# Patient Record
Sex: Male | Born: 1995 | Hispanic: No | Marital: Single | Smoking: Former smoker
Health system: Southern US, Community
[De-identification: ages and names within clinical notes are randomized; demographics above are authoritative.]

## PROBLEM LIST (undated history)

## (undated) DIAGNOSIS — N289 Disorder of kidney and ureter, unspecified: Secondary | ICD-10-CM

---

## 2016-06-18 ENCOUNTER — Encounter (HOSPITAL_COMMUNITY): Payer: Self-pay | Admitting: Neurology

## 2016-06-18 ENCOUNTER — Emergency Department (HOSPITAL_COMMUNITY): Payer: Self-pay

## 2016-06-18 ENCOUNTER — Emergency Department (HOSPITAL_COMMUNITY)
Admission: EM | Admit: 2016-06-18 | Discharge: 2016-06-18 | Disposition: A | Discharge status: Home / Self Care | Payer: Self-pay | Attending: Emergency Medicine | Admitting: Emergency Medicine

## 2016-06-18 DIAGNOSIS — R1031 Right lower quadrant pain: Secondary | ICD-10-CM | POA: Insufficient documentation

## 2016-06-18 DIAGNOSIS — R109 Unspecified abdominal pain: Secondary | ICD-10-CM

## 2016-06-18 DIAGNOSIS — F17200 Nicotine dependence, unspecified, uncomplicated: Secondary | ICD-10-CM | POA: Insufficient documentation

## 2016-06-18 LAB — COMPREHENSIVE METABOLIC PANEL
ALBUMIN: 4.5 g/dL (ref 3.5–5.0)
ALK PHOS: 58 U/L (ref 38–126)
ALT: 16 U/L — AB (ref 17–63)
AST: 25 U/L (ref 15–41)
Anion gap: 8 (ref 5–15)
BILIRUBIN TOTAL: 0.6 mg/dL (ref 0.3–1.2)
BUN: 15 mg/dL (ref 6–20)
CALCIUM: 9.8 mg/dL (ref 8.9–10.3)
CO2: 24 mmol/L (ref 22–32)
CREATININE: 1 mg/dL (ref 0.61–1.24)
Chloride: 108 mmol/L (ref 101–111)
GFR calc Af Amer: >60 mL/min (ref >60)
GFR calc non Af Amer: >60 mL/min (ref >60)
GLUCOSE: 124 mg/dL — AB (ref 65–99)
POTASSIUM: 3.8 mmol/L (ref 3.5–5.1)
Sodium: 140 mmol/L (ref 135–145)
TOTAL PROTEIN: 6.8 g/dL (ref 6.5–8.1)

## 2016-06-18 LAB — URINALYSIS, ROUTINE W REFLEX MICROSCOPIC
Bilirubin Urine: NEGATIVE
GLUCOSE, UA: NEGATIVE mg/dL
HGB URINE DIPSTICK: NEGATIVE
Ketones, ur: NEGATIVE mg/dL
Leukocytes, UA: NEGATIVE
NITRITE: NEGATIVE
PH: 7 (ref 5.0–8.0)
Protein, ur: NEGATIVE mg/dL
Specific Gravity, Urine: 1.02 (ref 1.005–1.030)

## 2016-06-18 LAB — CBC
HEMATOCRIT: 41.4 % (ref 39.0–52.0)
Hemoglobin: 13.8 g/dL (ref 13.0–17.0)
MCH: 30.5 pg (ref 26.0–34.0)
MCHC: 33.3 g/dL (ref 30.0–36.0)
MCV: 91.4 fL (ref 78.0–100.0)
PLATELETS: 231 10*3/uL (ref 150–400)
RBC: 4.53 MIL/uL (ref 4.22–5.81)
RDW: 12.4 % (ref 11.5–15.5)
WBC: 5.9 10*3/uL (ref 4.0–10.5)

## 2016-06-18 LAB — LIPASE, BLOOD: Lipase: 25 U/L (ref 11–51)

## 2016-06-18 MED ORDER — SODIUM CHLORIDE 0.9 % IV BOLUS (SEPSIS)
1000.0000 mL | Freq: Once | INTRAVENOUS | Status: AC
  Administered 2016-06-18: 1000 mL via INTRAVENOUS

## 2016-06-18 NOTE — ED Provider Notes (Signed)
[facility]-EMERGENCY DEPT Provider Note   CSN: [id number] Arrival date & time: 06/18/16  1132     History   Chief Complaint Chief Complaint  Patient presents with  . Abdominal Pain    HPI Jeremaih Klima is a 20 y.o. male.  The history is provided by the patient and medical records. No language interpreter was used.   Sirus Labrie is an otherwise healthy 20 y.o. male  who presents to the Emergency Department complaining of sharp RLQ abdominal pain that radiates to his groin that began this morning associated with hematuria. Also endorses "stinging" pain with urination and intermittent nausea. Hx of kidney stones and pain today feels similar. Patient states that he had an erection yesterday which lasted approximately four hours and is unsure if this is related. He states scrotum was swollen yesterday but this has since resolved. No discharge, fevers, vomiting, constipation, diarrhea.    History reviewed. No pertinent past medical history.  There are no active problems to display for this patient.   History reviewed. No pertinent surgical history.     Home Medications    Prior to Admission medications   Not on File    Family History No family history on file.  Social History Social History  Substance Use Topics  . Smoking status: Current Every Day Smoker  . Smokeless tobacco: Never Used  . Alcohol use Yes     Allergies   Review of patient's allergies indicates no known allergies.   Review of Systems Review of Systems  Constitutional: Negative for chills and fever.  HENT: Negative for congestion.   Eyes: Negative for visual disturbance.  Respiratory: Negative for cough and shortness of breath.   Cardiovascular: Negative.   Gastrointestinal: Positive for abdominal pain. Negative for nausea and vomiting.  Genitourinary: Positive for dysuria ("Stinging") and scrotal swelling (Resolved). Negative for discharge and penile swelling.  Musculoskeletal: Negative for back  pain and neck pain.  Skin: Negative for rash.  Neurological: Negative for headaches.     Physical Exam Updated Vital Signs BP 105/90 (BP Location: Left Arm)   Pulse 66   Temp 98.2 F (36.8 C) (Oral)   Resp 14   Ht 6\' 3"  (1.905 m)   Wt 86.2 kg   SpO2 99%   BMI 23.75 kg/m   Physical Exam  Constitutional: He is oriented to person, place, and time. He appears well-developed and well-nourished. No distress.  HENT:  Head: Normocephalic and atraumatic.  Cardiovascular: Normal rate, regular rhythm and normal heart sounds.   Pulmonary/Chest: Effort normal and breath sounds normal. No respiratory distress. He has no wheezes. He has no rales. He exhibits no tenderness.  Abdominal: Soft. Bowel sounds are normal. He exhibits no distension. There is tenderness.    No tenderness at McBurney's point. No CVA tenderness.   Genitourinary:  Genitourinary Comments: Chaperone present for exam. Mild tenderness to palpation of bilateral testicles with no swelling or masses appreciated. No signs of lesion or erythema on the penis or testicles. The penis is nontender. No signs of any inguinal hernias. No signs of any discharge from the penis. Cremaster reflex present bilaterally.   Musculoskeletal: He exhibits no edema.  Neurological: He is alert and oriented to person, place, and time.  Skin: Skin is warm and dry.  Nursing note and vitals reviewed.    ED Treatments / Results  Labs (all labs ordered are listed, but only abnormal results are displayed) Labs Reviewed  COMPREHENSIVE METABOLIC PANEL - Abnormal; Notable for the following:  Result Value   Glucose, Bld 124 (*)    ALT 16 (*)    All other components within normal limits  LIPASE, BLOOD  CBC  URINALYSIS, ROUTINE W REFLEX MICROSCOPIC ([facility])    EKG  EKG Interpretation None       Radiology [place] Scrotum  Result Date: 06/18/2016 CLINICAL DATA:  20 year old male with right groin pain for 1 day with no known injury.  Initial encounter. EXAM: SCROTAL ULTRASOUND DOPPLER ULTRASOUND OF THE TESTICLES TECHNIQUE: Complete ultrasound examination of the testicles, epididymis, and other scrotal structures was performed. Color and spectral Doppler ultrasound were also utilized to evaluate blood flow to the testicles. COMPARISON:  Noncontrast CT Abdomen and Pelvis 1355 hours today. FINDINGS: Right testicle Measurements: 5.4 x 2.6 x 3.6 cm. No mass or microlithiasis visualized. Left testicle Measurements: 5.3 x 2.9 x 3.2 cm. No mass or microlithiasis visualized. Right epididymis:  Normal in size and appearance. Left epididymis: Normal in size and appearance. Incidental 8 mm epididymal head cyst (normal variant). Hydrocele:  None visualized. Varicocele:  None visualized. Pulsed Doppler interrogation of both testes demonstrates normal low resistance arterial and venous waveforms bilaterally. IMPRESSION: Normal scrotal ultrasound with Doppler. No evidence of testicular torsion. Electronically Signed   By: Odessa FlemingH  Hall M.D.   On: 06/18/2016 15:23   [place] Art/ven Flow Abd Pelv Doppler  Result Date: 06/18/2016 CLINICAL DATA:  20 year old male with right groin pain for 1 day with no known injury. Initial encounter. EXAM: SCROTAL ULTRASOUND DOPPLER ULTRASOUND OF THE TESTICLES TECHNIQUE: Complete ultrasound examination of the testicles, epididymis, and other scrotal structures was performed. Color and spectral Doppler ultrasound were also utilized to evaluate blood flow to the testicles. COMPARISON:  Noncontrast CT Abdomen and Pelvis 1355 hours today. FINDINGS: Right testicle Measurements: 5.4 x 2.6 x 3.6 cm. No mass or microlithiasis visualized. Left testicle Measurements: 5.3 x 2.9 x 3.2 cm. No mass or microlithiasis visualized. Right epididymis:  Normal in size and appearance. Left epididymis: Normal in size and appearance. Incidental 8 mm epididymal head cyst (normal variant). Hydrocele:  None visualized. Varicocele:  None visualized. Pulsed Doppler  interrogation of both testes demonstrates normal low resistance arterial and venous waveforms bilaterally. IMPRESSION: Normal scrotal ultrasound with Doppler. No evidence of testicular torsion. Electronically Signed   By: Odessa FlemingH  Hall M.D.   On: 06/18/2016 15:23   Ct Renal Stone Study  Result Date: 06/18/2016 CLINICAL DATA:  Right flank pain and hematuria. EXAM: CT ABDOMEN AND PELVIS WITHOUT CONTRAST TECHNIQUE: Multidetector CT imaging of the abdomen and pelvis was performed following the standard protocol without IV contrast. COMPARISON:  None. FINDINGS: Lower chest: Right lower lobe 3 mm pulmonary nodule (series 6/image 1). Hepatobiliary: Normal liver with no liver mass. Normal gallbladder with no radiopaque cholelithiasis. No biliary ductal dilatation. Pancreas: Normal, with no mass or duct dilation. Spleen: Normal size. No mass. Adrenals/Urinary Tract: Normal adrenals. No renal stones. No hydronephrosis. No contour deforming renal mass. Normal caliber ureters, with no ureteral stones. No bladder stones. No bladder wall thickening. Stomach/Bowel: Grossly normal stomach. Normal caliber small bowel with no small bowel wall thickening. Normal appendix. Normal large bowel with no diverticulosis, large bowel wall thickening or pericolonic fat stranding. Vascular/Lymphatic: Normal caliber abdominal aorta. No pathologically enlarged lymph nodes in the abdomen or pelvis. Reproductive: Normal size prostate. Other: No pneumoperitoneum, ascites or focal fluid collection. Musculoskeletal: No aggressive appearing focal osseous lesions. IMPRESSION: 1. No acute abnormality. No urolithiasis. No evidence of urinary tract obstruction. No evidence of bowel  obstruction or acute bowel inflammation. 2. Solitary 3 mm right lower lobe pulmonary nodule, for which no further follow-up is recommended unless there are significant risk factors for lung malignancy. Electronically Signed   By: Delbert Phenix M.D.   On: 06/18/2016 14:11     Procedures Procedures (including critical care time)  Medications Ordered in ED Medications  sodium chloride 0.9 % bolus 1,000 mL (1,000 mLs Intravenous New Bag/Given 06/18/16 1319)     Initial Impression / Assessment and Plan / ED Course  I have reviewed the triage vital signs and the nursing notes.  Pertinent labs & imaging results that were available during my care of the patient were reviewed by me and considered in my medical decision making (see chart for details).  Clinical Course   Elric Tirado is a 20 y.o. male who presents to ED for right flank and groin pain since this morning. Patient is well appearing, afebrile and hemodynamically stable. GU exam with mild tenderness to bilateral testicles but otherwise benign. Labs reviewed and reassuring. CT stone study and scrotal ultrasound reassuring. Evaluation does not show pathology that would require ongoing emergent intervention or inpatient treatment. PCP follow up if symptoms persist. Discussed findings and plan with patient who agrees with treatment plan as dictated. Return precautions discussed and all questions answered.   Final Clinical Impressions(s) / ED Diagnoses   Final diagnoses:  Right flank pain  Right groin pain  Right groin pain    New Prescriptions New Prescriptions   No medications on file     [facility], PA-C 06/18/16 1604    Marily Memos, MD 06/19/16 [phone]

## 2016-06-18 NOTE — ED Triage Notes (Addendum)
Pt reports today was at work, felt sharp pain RLQ radiating into right testicle and rectum. Went to bathroom and had blood in urine, x 2. Pain is intermittent. Reports nausea, denies v/d. No swelling in testicle. Reports long lasting erection for 4 hours yesterday. Pt is a x 4, sitting comfortably in chair, answering questions appropriately. Also reports penile pain and feeling like it might bust.

## 2016-06-18 NOTE — ED Notes (Signed)
Pt unable le to void at this time.

## 2016-06-18 NOTE — Discharge Instructions (Signed)
Ibuprofen as needed for pain. Follow up with primary care provider if symptoms persist.  Return to ER for new or worsening symptoms, any additional concerns.

## 2016-11-06 ENCOUNTER — Emergency Department (HOSPITAL_COMMUNITY): Payer: Self-pay

## 2016-11-06 ENCOUNTER — Encounter (HOSPITAL_COMMUNITY): Payer: Self-pay | Admitting: Emergency Medicine

## 2016-11-06 ENCOUNTER — Emergency Department (HOSPITAL_COMMUNITY)
Admission: EM | Admit: 2016-11-06 | Discharge: 2016-11-06 | Disposition: A | Discharge status: Home / Self Care | Payer: Self-pay | Attending: Emergency Medicine | Admitting: Emergency Medicine

## 2016-11-06 DIAGNOSIS — F17200 Nicotine dependence, unspecified, uncomplicated: Secondary | ICD-10-CM | POA: Insufficient documentation

## 2016-11-06 DIAGNOSIS — R1013 Epigastric pain: Secondary | ICD-10-CM | POA: Insufficient documentation

## 2016-11-06 DIAGNOSIS — Z79899 Other long term (current) drug therapy: Secondary | ICD-10-CM | POA: Insufficient documentation

## 2016-11-06 LAB — COMPREHENSIVE METABOLIC PANEL
ALK PHOS: 47 U/L (ref 38–126)
ALT: 11 U/L — AB (ref 17–63)
AST: 16 U/L (ref 15–41)
Albumin: 4.7 g/dL (ref 3.5–5.0)
Anion gap: 9 (ref 5–15)
BILIRUBIN TOTAL: 0.8 mg/dL (ref 0.3–1.2)
BUN: 14 mg/dL (ref 6–20)
CALCIUM: 9.4 mg/dL (ref 8.9–10.3)
CO2: 24 mmol/L (ref 22–32)
Chloride: 106 mmol/L (ref 101–111)
Creatinine, Ser: 1 mg/dL (ref 0.61–1.24)
GFR calc Af Amer: >60 mL/min (ref >60)
GFR calc non Af Amer: >60 mL/min (ref >60)
Glucose, Bld: 90 mg/dL (ref 65–99)
Potassium: 3.8 mmol/L (ref 3.5–5.1)
SODIUM: 139 mmol/L (ref 135–145)
TOTAL PROTEIN: 7.4 g/dL (ref 6.5–8.1)

## 2016-11-06 LAB — URINALYSIS, ROUTINE W REFLEX MICROSCOPIC
BACTERIA UA: NONE SEEN
Bilirubin Urine: NEGATIVE
Glucose, UA: NEGATIVE mg/dL
Hgb urine dipstick: NEGATIVE
KETONES UR: NEGATIVE mg/dL
Leukocytes, UA: NEGATIVE
NITRITE: NEGATIVE
PH: 7 (ref 5.0–8.0)
PROTEIN: NEGATIVE mg/dL
Specific Gravity, Urine: 1.025 (ref 1.005–1.030)
Squamous Epithelial / LPF: NONE SEEN

## 2016-11-06 LAB — CBC
HCT: 38.8 % — ABNORMAL LOW (ref 39.0–52.0)
Hemoglobin: 13.1 g/dL (ref 13.0–17.0)
MCH: 30.4 pg (ref 26.0–34.0)
MCHC: 33.8 g/dL (ref 30.0–36.0)
MCV: 90 fL (ref 78.0–100.0)
Platelets: 198 10*3/uL (ref 150–400)
RBC: 4.31 MIL/uL (ref 4.22–5.81)
RDW: 12.3 % (ref 11.5–15.5)
WBC: 5 10*3/uL (ref 4.0–10.5)

## 2016-11-06 LAB — LIPASE, BLOOD: Lipase: 25 U/L (ref 11–51)

## 2016-11-06 MED ORDER — GI COCKTAIL ~~LOC~~
30.0000 mL | Freq: Once | ORAL | Status: AC
  Administered 2016-11-06: 30 mL via ORAL
  Filled 2016-11-06: qty 30

## 2016-11-06 MED ORDER — PANTOPRAZOLE SODIUM 40 MG IV SOLR
40.0000 mg | Freq: Once | INTRAVENOUS | Status: AC
  Administered 2016-11-06: 40 mg via INTRAVENOUS
  Filled 2016-11-06: qty 40

## 2016-11-06 MED ORDER — SUCRALFATE 1 G PO TABS: 1.0000 g | ORAL_TABLET | Freq: Three times a day (TID) | ORAL | 0 refills | Status: DC

## 2016-11-06 MED ORDER — METOCLOPRAMIDE HCL 10 MG PO TABS: 10.0000 mg | ORAL_TABLET | Freq: Four times a day (QID) | ORAL | 0 refills | Status: DC | PRN

## 2016-11-06 MED ORDER — METOCLOPRAMIDE HCL 5 MG/ML IJ SOLN
10.0000 mg | Freq: Once | INTRAMUSCULAR | Status: AC
  Administered 2016-11-06: 10 mg via INTRAVENOUS
  Filled 2016-11-06: qty 2

## 2016-11-06 MED ORDER — OMEPRAZOLE 20 MG PO CPDR: 20.0000 mg | DELAYED_RELEASE_CAPSULE | Freq: Every day | ORAL | 0 refills | Status: DC

## 2016-11-06 MED ORDER — SODIUM CHLORIDE 0.9 % IV BOLUS (SEPSIS)
1000.0000 mL | Freq: Once | INTRAVENOUS | Status: AC
  Administered 2016-11-06: 1000 mL via INTRAVENOUS

## 2016-11-06 NOTE — Discharge Instructions (Signed)
Read the information below.  Use the prescribed medication as directed.  Please discuss all new medications with your pharmacist.  You may return to the Emergency Department at any time for worsening condition or any new symptoms that concern you.   If you develop high fevers, worsening abdominal pain, uncontrolled vomiting, or are unable to tolerate fluids by mouth, return to the ER for a recheck.  ° °

## 2016-11-06 NOTE — ED Provider Notes (Signed)
[facility]-EMERGENCY DEPT Provider Note   CSN: [id number] Arrival date & time: 11/06/16  1559     History   Chief Complaint Chief Complaint  Patient presents with  . Abdominal Pain    LUQ    HPI Brian Frank is a 21 y.o. male.  HPI   Pt presents with 3 weeks of upper abdominal pressure, bloated, belching, vomiting, worse after eating.  The pain and symptoms occur immediately after eating, also worse with leaning forward.  Nausea somewhat controlled with marijuana.  Feels the urge to defecate but has had decreased bowel movements.  One episode of black stool, no red bloody stools.  Also occasionally passes stool when attempting to pas flatus.  Denies NSAID usage, etoh use.  No heavy lifting.  The symptoms began after the patient spent two weeks eating only [employer] pizza and only one meal/day.  No family hx inflammatory bowel disease.  Mother has hx gastric ulcer.    History reviewed. No pertinent past medical history.  There are no active problems to display for this patient.   History reviewed. No pertinent surgical history.     Home Medications    Prior to Admission medications   Medication Sig Start Date End Date Taking? Authorizing Provider  metoCLOPramide (REGLAN) 10 MG tablet Take 1 tablet (10 mg total) by mouth every 6 (six) hours as needed for nausea or vomiting. 11/06/16   Trixie Dredge, PA-C  omeprazole (PRILOSEC) 20 MG capsule Take 1 capsule (20 mg total) by mouth daily. 11/06/16   Trixie Dredge, PA-C  sucralfate (CARAFATE) 1 g tablet Take 1 tablet (1 g total) by mouth 4 (four) times daily -  with meals and at bedtime. 11/06/16   Trixie Dredge, PA-C    Family History No family history on file.  Social History Social History  Substance Use Topics  . Smoking status: Current Every Day Smoker  . Smokeless tobacco: Never Used  . Alcohol use Yes     Allergies   Patient has no known allergies.   Review of Systems Review of Systems  All other systems reviewed and are  negative.    Physical Exam Updated Vital Signs BP 108/77   Pulse 72   Temp 98 F (36.7 C)   Resp 18   SpO2 100%   Physical Exam  Constitutional: He appears well-developed and well-nourished. No distress.  HENT:  Head: Normocephalic and atraumatic.  Neck: Neck supple.  Cardiovascular: Normal rate and regular rhythm.   Pulmonary/Chest: Effort normal and breath sounds normal. No respiratory distress. He has no wheezes. He has no rales.  Abdominal: Soft. He exhibits no distension and no mass. There is tenderness (diffuse tenderness, worse in upper abdomen). There is no rebound, no guarding and negative Murphy's sign.  Neurological: He is alert. He exhibits normal muscle tone.  Skin: He is not diaphoretic.  Nursing note and vitals reviewed.    ED Treatments / Results  Labs (all labs ordered are listed, but only abnormal results are displayed) Labs Reviewed  COMPREHENSIVE METABOLIC PANEL - Abnormal; Notable for the following:       Result Value   ALT 11 (*)    All other components within normal limits  CBC - Abnormal; Notable for the following:    HCT 38.8 (*)    All other components within normal limits  URINALYSIS, ROUTINE W REFLEX MICROSCOPIC - Abnormal; Notable for the following:    APPearance CLOUDY (*)    All other components within normal limits  LIPASE,  BLOOD    EKG  EKG Interpretation None       Radiology [place] Abdomen Complete  Result Date: 11/06/2016 CLINICAL DATA:  Upper abdominal pain, bloating, and vomiting. Left upper quadrant pain after eating with nausea and vomiting for 3 weeks. EXAM: ABDOMEN ULTRASOUND COMPLETE COMPARISON:  CT abdomen and pelvis 06/18/2016 FINDINGS: Gallbladder: No gallstones or wall thickening visualized. No sonographic Murphy sign noted by sonographer. Common bile duct: Diameter: 2.6 mm, normal Liver: No focal lesion identified. Within normal limits in parenchymal echogenicity. IVC: Limited visualization due to bowel gas. Visualized  intrahepatic portion is unremarkable. Pancreas: Visualized portion unremarkable. Spleen: Size and appearance within normal limits. Right Kidney: Length: 12.5 cm. Echogenicity within normal limits. No mass or hydronephrosis visualized. Left Kidney: Length: 10.8 cm. Echogenicity within normal limits. No mass or hydronephrosis visualized. Abdominal aorta: No aneurysm visualized. Other findings: None. IMPRESSION: No acute abnormalities demonstrated. Electronically Signed   By: Burman NievesWilliam  Stevens M.D.   On: 11/06/2016 21:47    Procedures Procedures (including critical care time)  Medications Ordered in ED Medications  sodium chloride 0.9 % bolus 1,000 mL (0 mLs Intravenous Stopped 11/06/16 2200)  metoCLOPramide (REGLAN) injection 10 mg (10 mg Intravenous Given 11/06/16 2008)  pantoprazole (PROTONIX) injection 40 mg (40 mg Intravenous Given 11/06/16 2009)  gi cocktail (Maalox,Lidocaine,Donnatal) (30 mLs Oral Given 11/06/16 2008)     Initial Impression / Assessment and Plan / ED Course  I have reviewed the triage vital signs and the nursing notes.  Pertinent labs & imaging results that were available during my care of the patient were reviewed by me and considered in my medical decision making (see chart for details).    Discussed imaging with patient, he is uncomfortable with radiation (including CTs and xrays), US abdomen ordered.   Afebrile, nontoxic patient with upper abdominal pain, bloating, belching, N/V, change in bowel habits x weeks.  Labs unremarkable.  US negative.  Pt feeling much better after IVF, reglan, protonix, GI cocktail.  Suspect PUD vs less likely GERD or gastroparesis. D/C home with omeprazole, reglan, carafate, GI follow up.   Discussed result, findings, treatment, and follow up  with patient.  Pt given return precautions.  Pt verbalizes understanding and agrees with plan.       Final Clinical Impressions(s) / ED Diagnoses   Final diagnoses:  Epigastric pain    New  Prescriptions Discharge Medication List as of 11/06/2016 10:10 PM    START taking these medications   Details  metoCLOPramide (REGLAN) 10 MG tablet Take 1 tablet (10 mg total) by mouth every 6 (six) hours as needed for nausea or vomiting., Starting Wed 11/06/2016, Print    omeprazole (PRILOSEC) 20 MG capsule Take 1 capsule (20 mg total) by mouth daily., Starting Wed 11/06/2016, Print    sucralfate (CARAFATE) 1 g tablet Take 1 tablet (1 g total) by mouth 4 (four) times daily -  with meals and at bedtime., Starting Wed 11/06/2016, Print         Moroni Nester MiddlesexEmily Naim Murtha, PA-C 11/06/16 2246    Derwood KaplanAnkit Nanavati, MD 11/07/16 1422

## 2016-11-06 NOTE — ED Triage Notes (Signed)
Pt reports burning abd pain ULQ, sts pain is worse after eating or when he is hungry. denies Dx acid reflux nor gastric ulcer. Reports emesis x 1 today. intermittent diarrhea and constipation.

## 2016-11-06 NOTE — ED Notes (Signed)
Pt called for reasses vitals no response. 

## 2017-11-06 IMAGING — CT CT RENAL STONE PROTOCOL
2 of 4 series · 16 of 46 positions shown, 18 images · non-contrast
Comparison: None.

CLINICAL DATA: Right flank pain and hematuria.

EXAM:
CT ABDOMEN AND PELVIS WITHOUT CONTRAST
TECHNIQUE: Multidetector CT imaging of the abdomen and pelvis was performed
following the standard protocol without IV contrast.

[Series 2: renal stone 5mm · axial · 0.69mm/px · z∈[+945,+1390]mm · 13 of 99 slices shown, 15 images]
[im 5/99  soft-tissue]
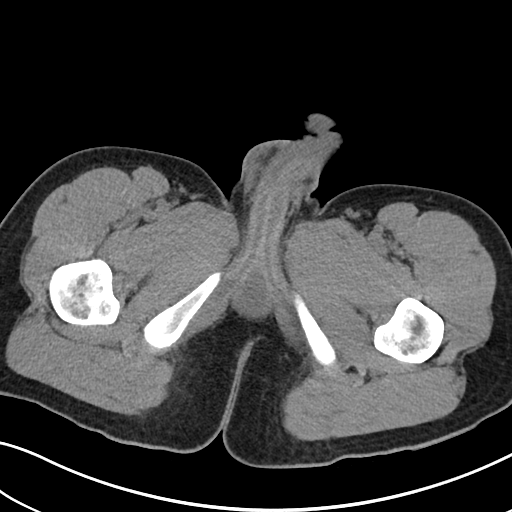
[im 5/99  bone]
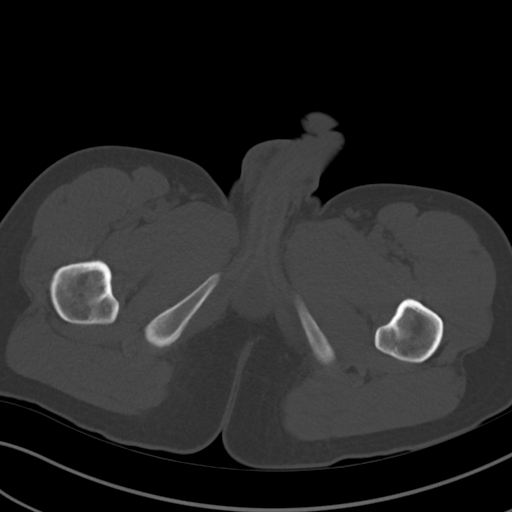
[im 13/99  soft-tissue]
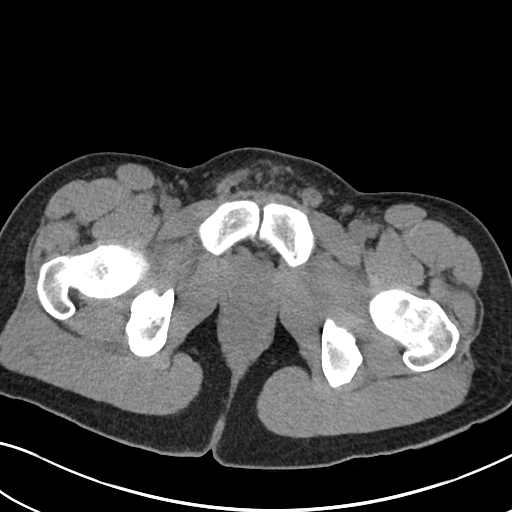
[im 21/99  soft-tissue]
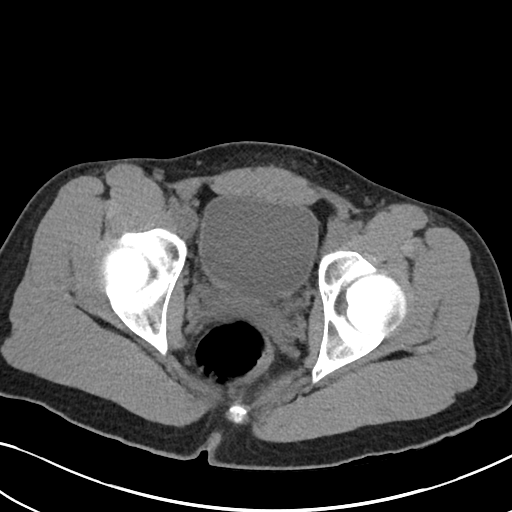
[im 29/99  soft-tissue]
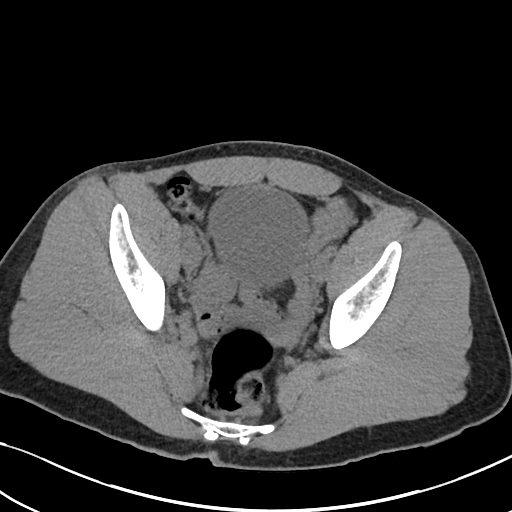
[im 33/99  soft-tissue]
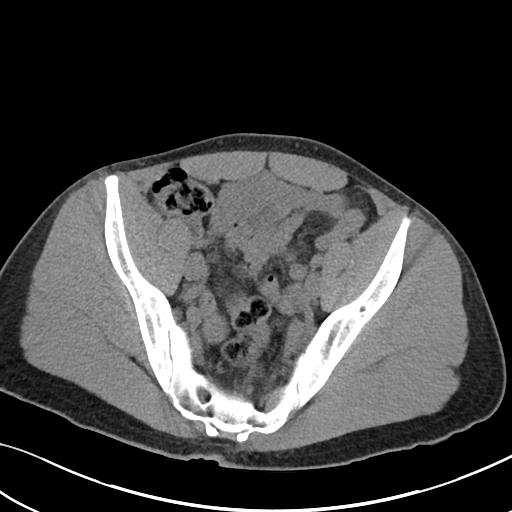
[im 41/99  soft-tissue]
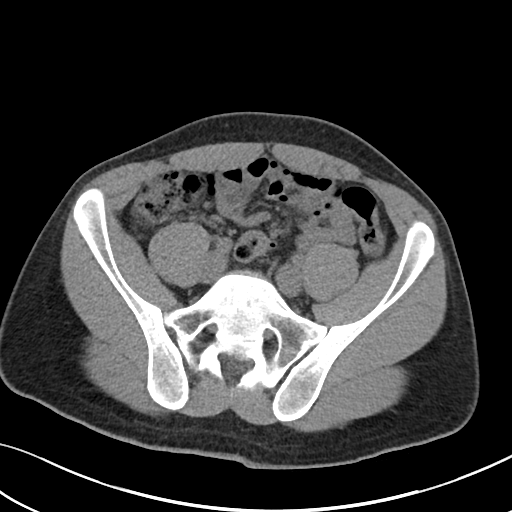
[im 50/99  soft-tissue]
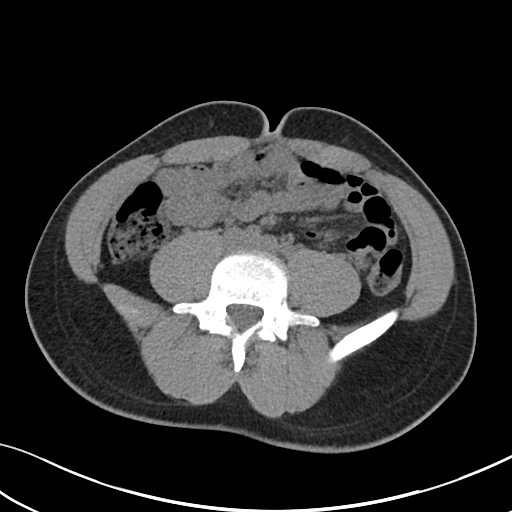
[im 58/99  soft-tissue]
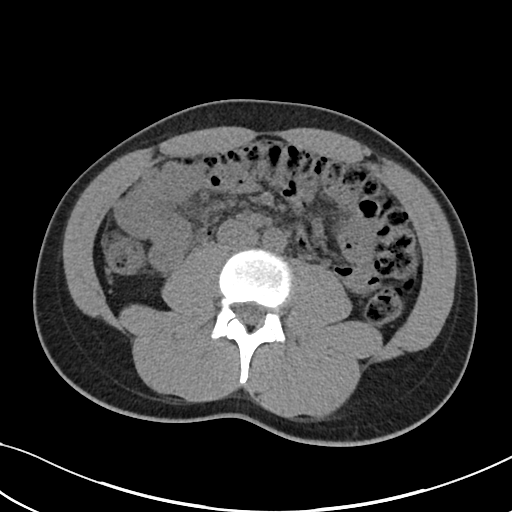
[im 66/99  soft-tissue]
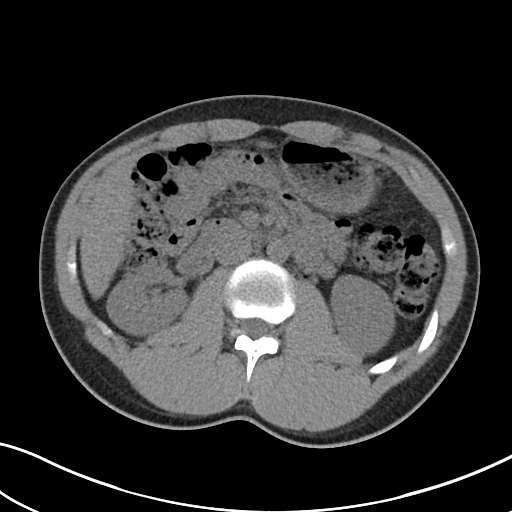
[im 66/99  bone]
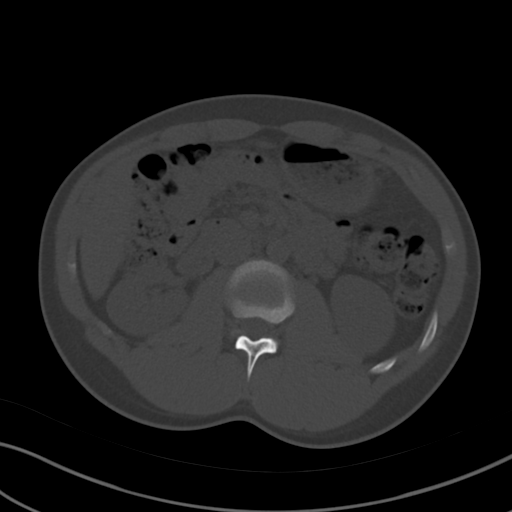
[im 70/99  soft-tissue]
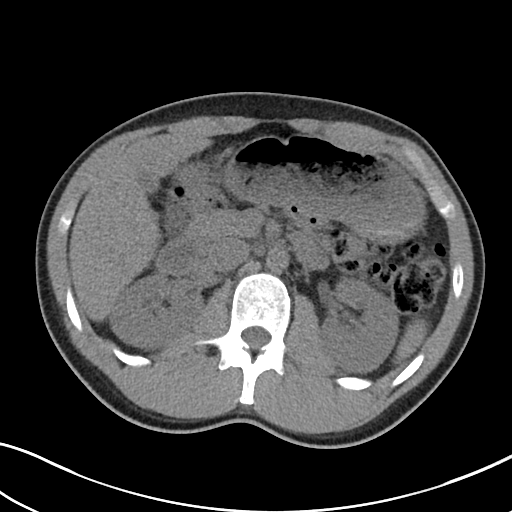
[im 78/99  soft-tissue]
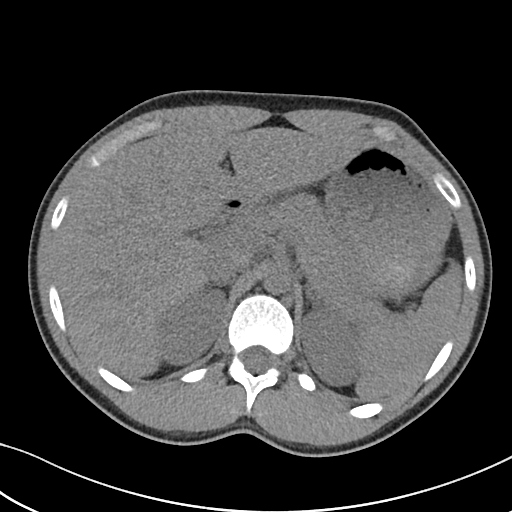
[im 86/99  soft-tissue]
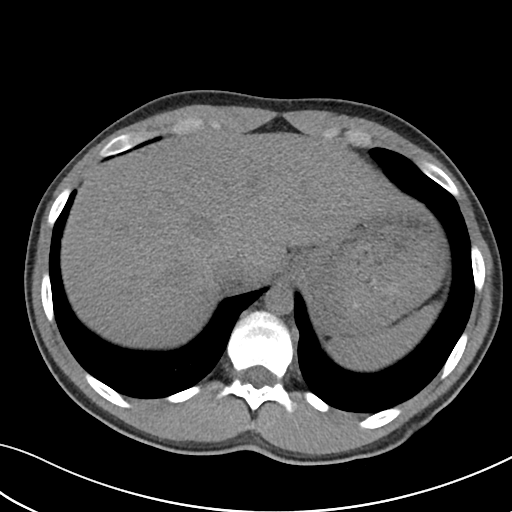
[im 94/99  soft-tissue]
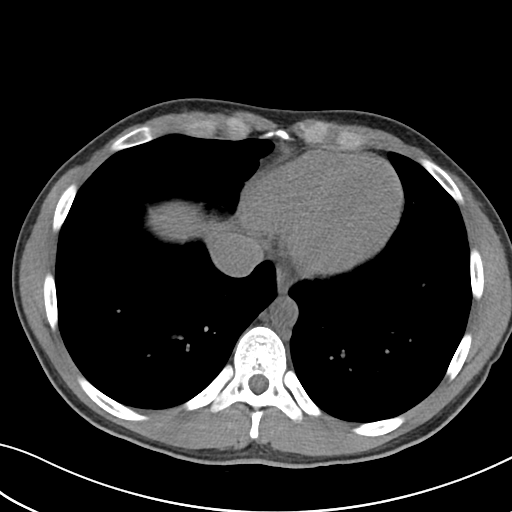

[Series 4: renal stone 3.0 cor · coronal · 0.69mm/px · 3 of 101 slices shown]
[im 34/101  soft-tissue]
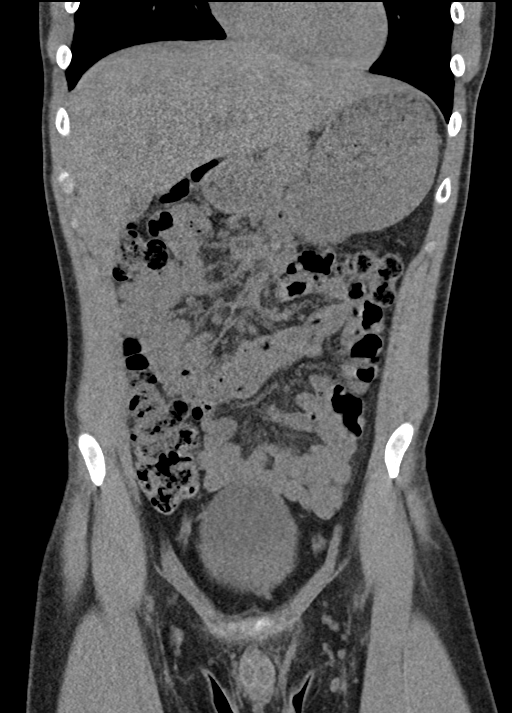
[im 45/101  soft-tissue]
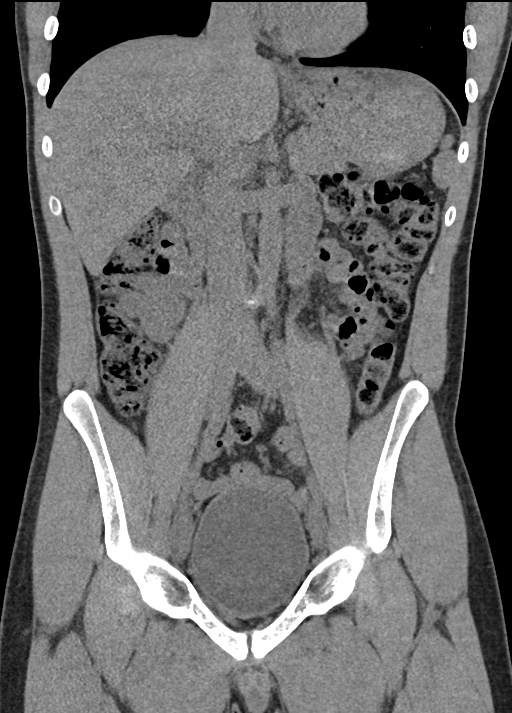
[im 56/101  soft-tissue]
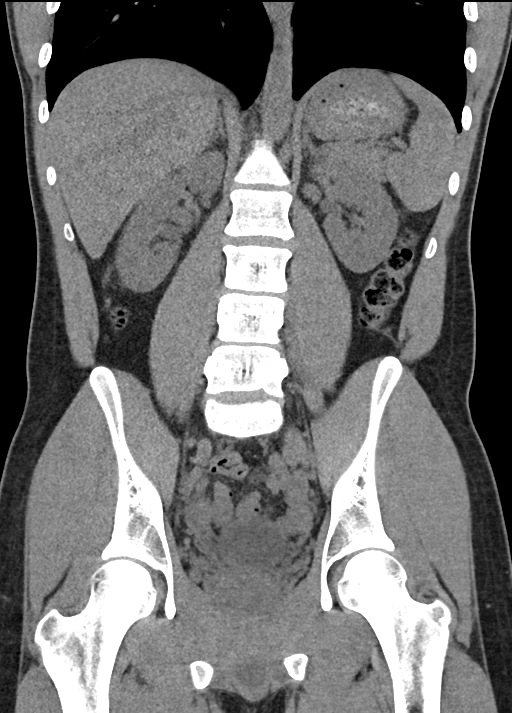

[16 of 46 positions shown; findings below may reference images not displayed]

FINDINGS: Lower chest: Right lower lobe 3 mm pulmonary nodule (series 6/image
1).

Hepatobiliary: Normal liver with no liver mass. Normal gallbladder
with no radiopaque cholelithiasis. No biliary ductal dilatation.

Pancreas: Normal, with no mass or duct dilation.

Spleen: Normal size. No mass.

Adrenals/Urinary Tract: Normal adrenals. No renal stones. No
hydronephrosis. No contour deforming renal mass. Normal caliber
ureters, with no ureteral stones. No bladder stones. No bladder wall
thickening.

Stomach/Bowel: Grossly normal stomach. Normal caliber small bowel
with no small bowel wall thickening. Normal appendix. Normal large
bowel with no diverticulosis, large bowel wall thickening or
pericolonic fat stranding.

Vascular/Lymphatic: Normal caliber abdominal aorta. No
pathologically enlarged lymph nodes in the abdomen or pelvis.

Reproductive: Normal size prostate.

Other: No pneumoperitoneum, ascites or focal fluid collection.

Musculoskeletal: No aggressive appearing focal osseous lesions.
IMPRESSION: 1. No acute abnormality. No urolithiasis. No evidence of urinary
tract obstruction. No evidence of bowel obstruction or acute bowel
inflammation.
2. Solitary 3 mm right lower lobe pulmonary nodule, for which no
further follow-up is recommended unless there are significant risk
factors for lung malignancy.

## 2018-06-21 IMAGING — US US ART/VEN ABD/PELV/SCROTUM DOPPLER LTD
1 series · 14 of 25 positions shown · non-contrast
Comparison: Noncontrast CT Abdomen and Pelvis 2700 hours today.

CLINICAL DATA: 20-year-old male with right groin pain for 1 day
with no known injury. Initial encounter.

EXAM:
SCROTAL ULTRASOUND
DOPPLER ULTRASOUND OF THE TESTICLES
TECHNIQUE: Complete ultrasound examination of the testicles, epididymis, and
other scrotal structures was performed. Color and spectral Doppler
ultrasound were also utilized to evaluate blood flow to the
testicles.

[Series 1: us art/ven abd/pelv/scrotum doppler ltd · 0.06mm/px · 14 of 53 slices shown]
[im 1/53]
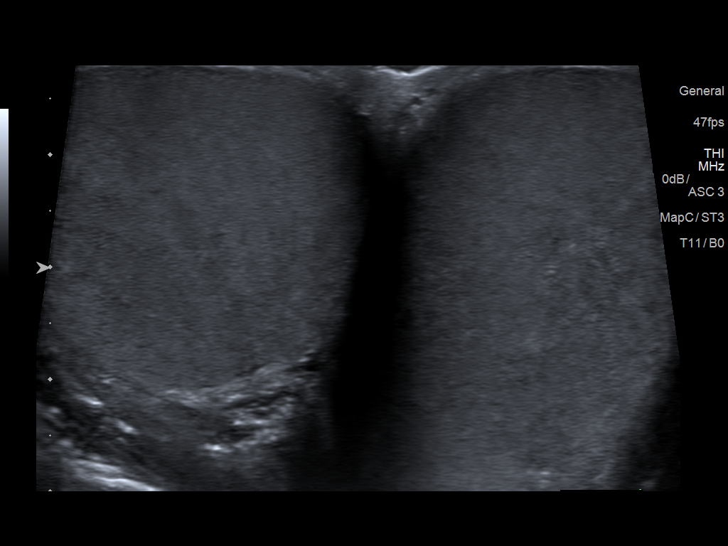
[im 5/53]
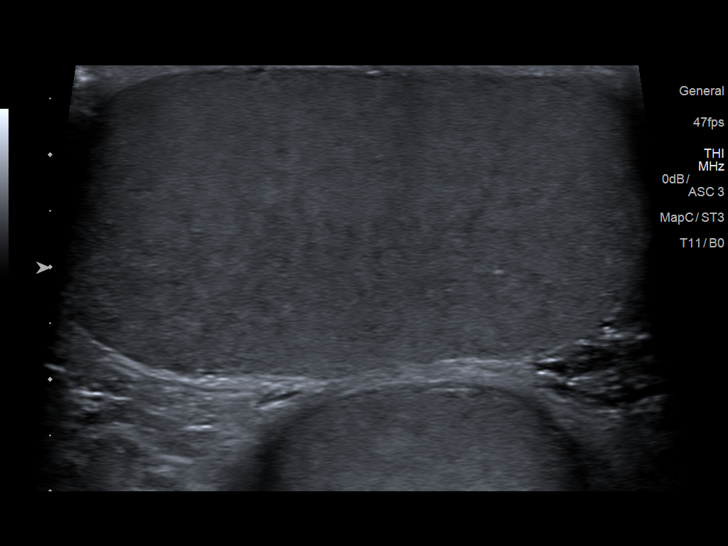
[im 9/53]
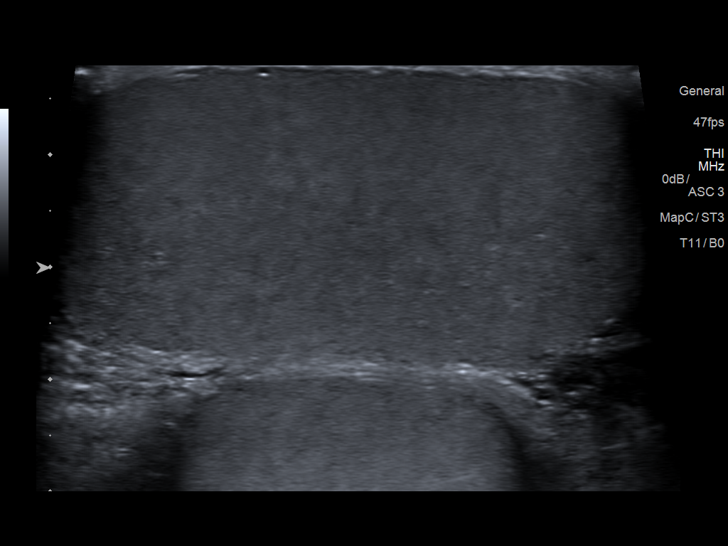
[im 14/53]
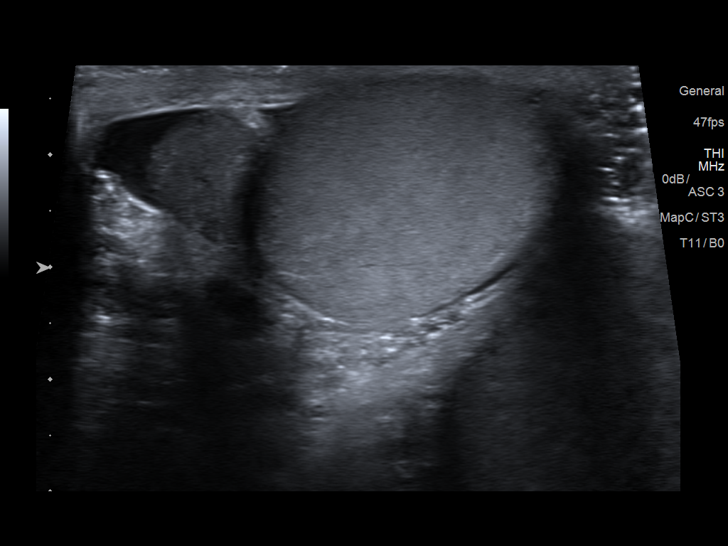
[im 18/53]
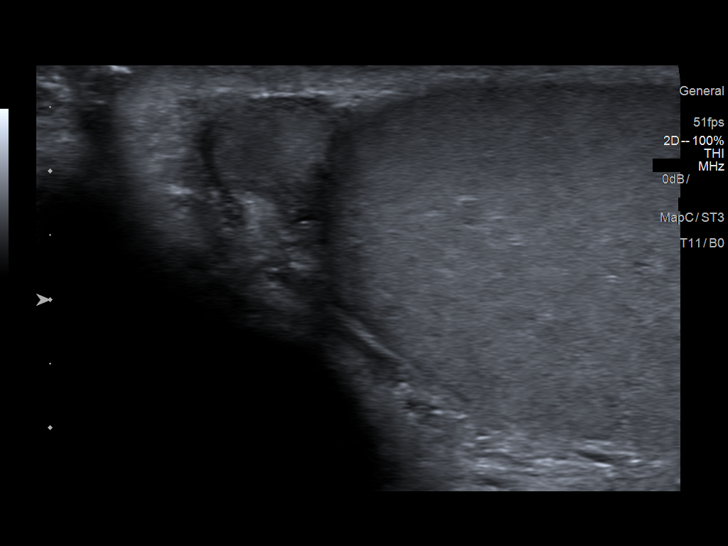
[im 20/53]
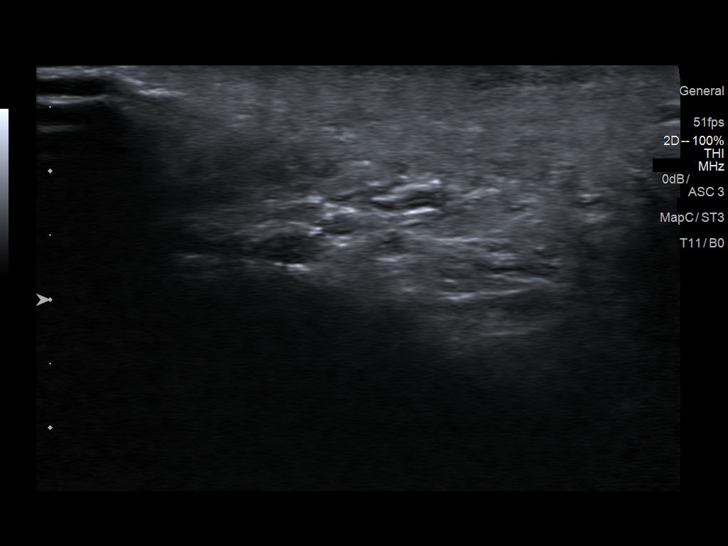
[im 24/53]
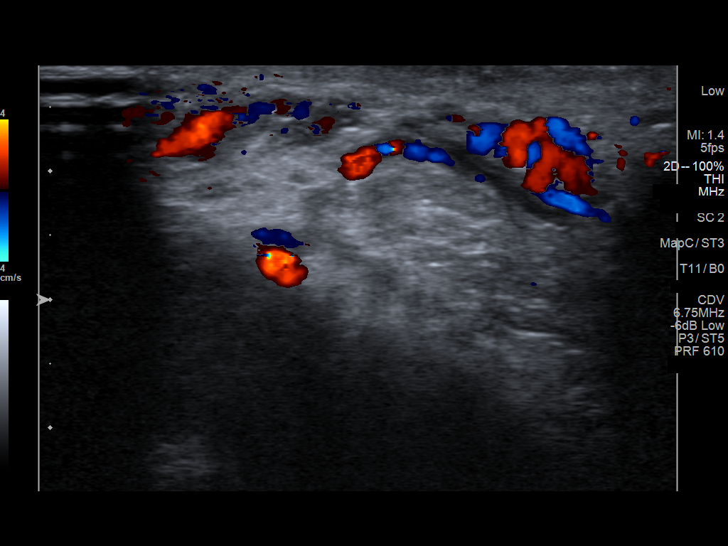
[im 29/53]
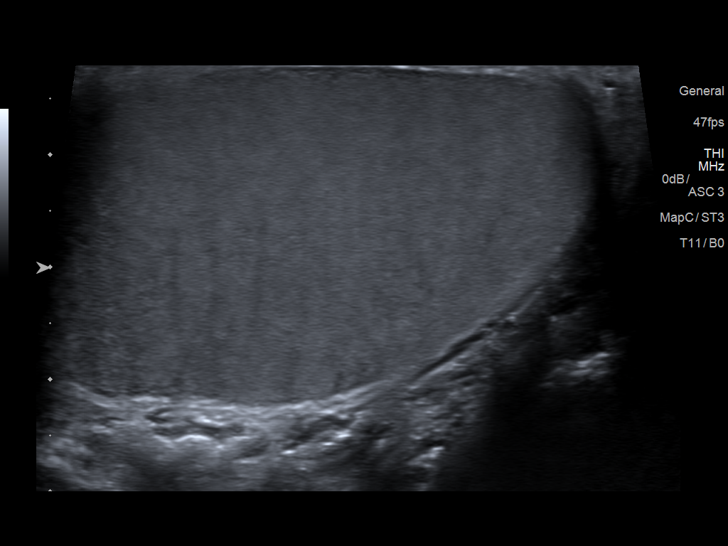
[im 33/53]
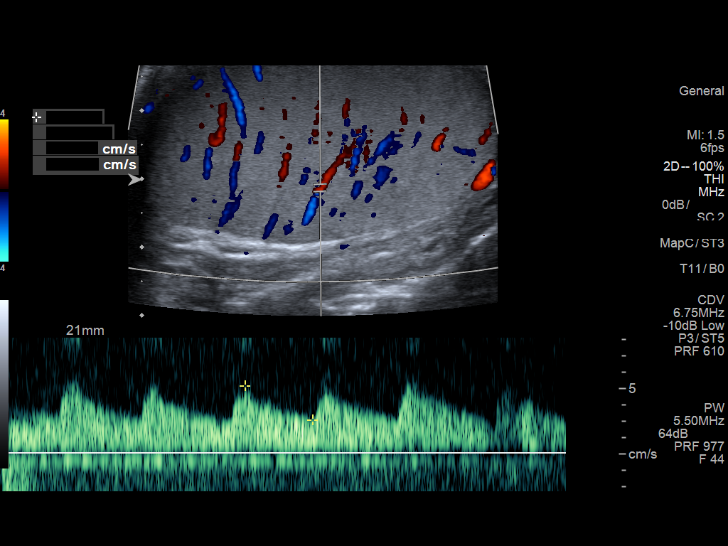
[im 35/53]
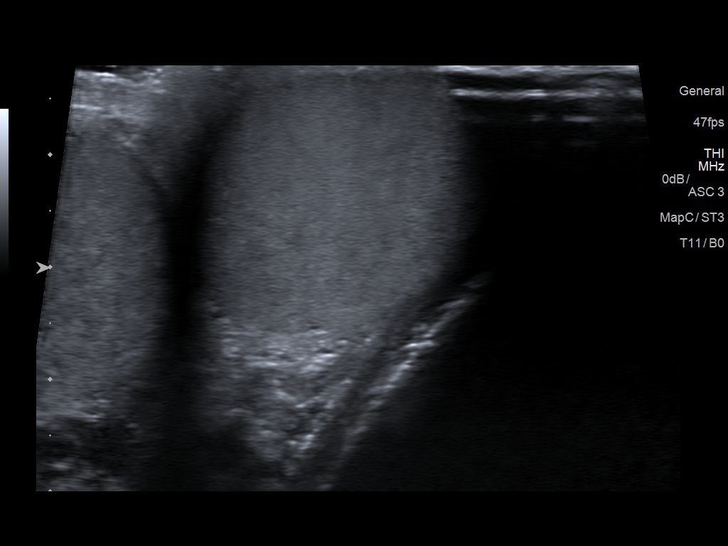
[im 40/53]
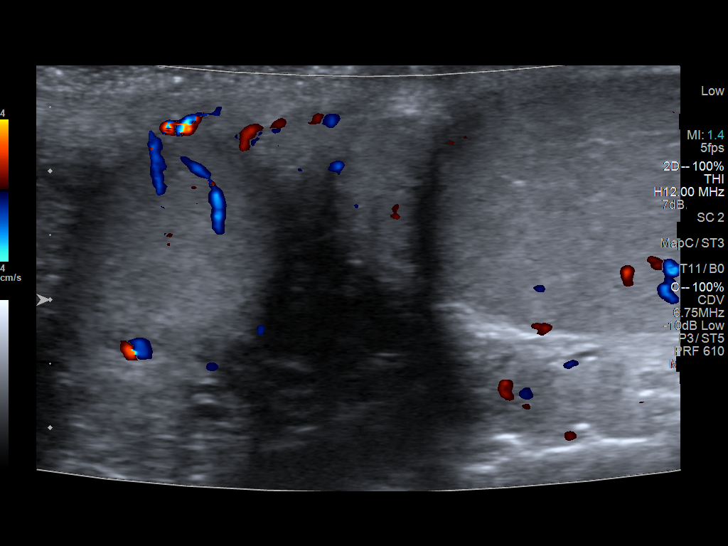
[im 44/53]
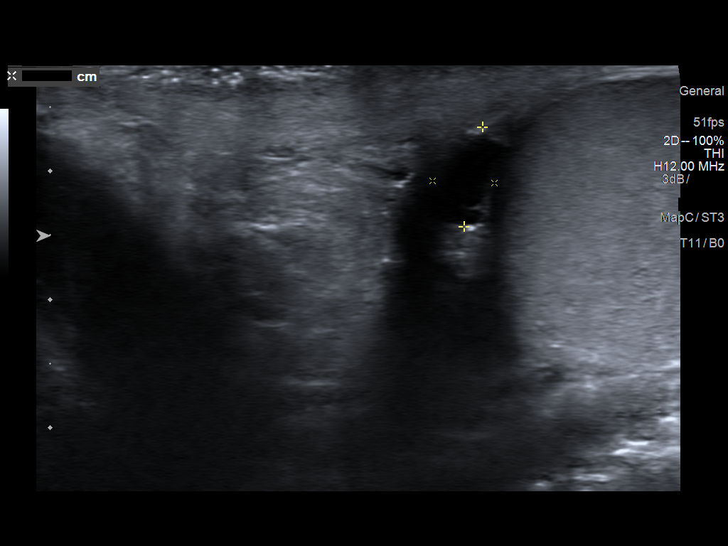
[im 48/53]
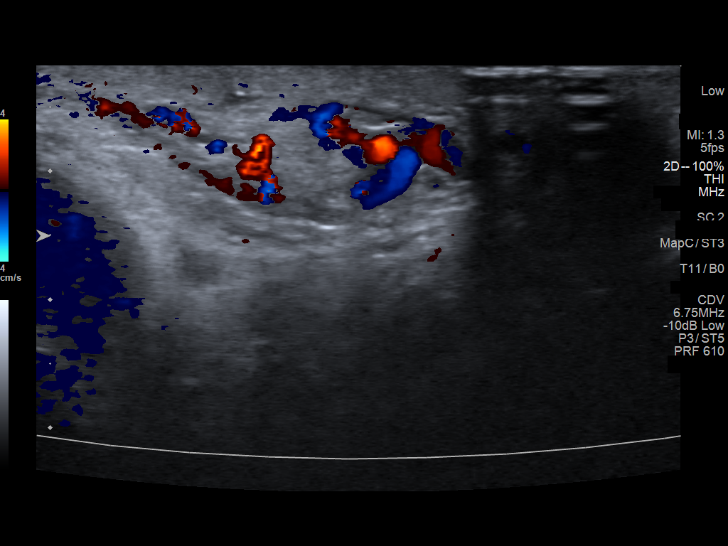
[im 53/53]
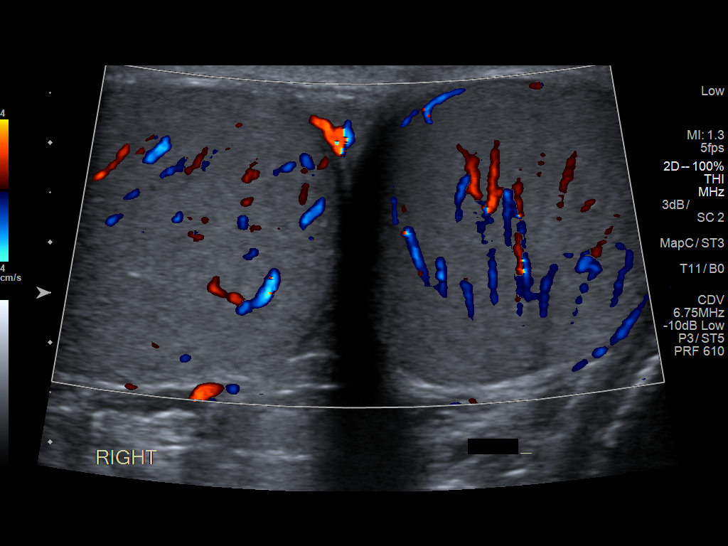

[14 of 25 positions shown; findings below may reference images not displayed]

FINDINGS: Right testicle

Measurements: 5.4 x 2.6 x 3.6 cm. No mass or microlithiasis
visualized.

Left testicle

Measurements: 5.3 x 2.9 x 3.2 cm. No mass or microlithiasis
visualized.

Right epididymis:  Normal in size and appearance.

Left epididymis: Normal in size and appearance. Incidental 8 mm
epididymal head cyst (normal variant).

Hydrocele:  None visualized.

Varicocele:  None visualized.

Pulsed Doppler interrogation of both testes demonstrates normal low
resistance arterial and venous waveforms bilaterally.
IMPRESSION: Normal scrotal ultrasound with Doppler. No evidence of testicular
torsion.

## 2023-01-12 ENCOUNTER — Other Ambulatory Visit: Payer: Self-pay

## 2023-01-12 ENCOUNTER — Emergency Department (HOSPITAL_BASED_OUTPATIENT_CLINIC_OR_DEPARTMENT_OTHER)
Admission: EM | Admit: 2023-01-12 | Discharge: 2023-01-12 | Disposition: A | Discharge status: Home / Self Care | Payer: 59 | Attending: Emergency Medicine | Admitting: Emergency Medicine

## 2023-01-12 DIAGNOSIS — Z20822 Contact with and (suspected) exposure to covid-19: Secondary | ICD-10-CM | POA: Diagnosis not present

## 2023-01-12 DIAGNOSIS — J069 Acute upper respiratory infection, unspecified: Secondary | ICD-10-CM | POA: Insufficient documentation

## 2023-01-12 DIAGNOSIS — H9203 Otalgia, bilateral: Secondary | ICD-10-CM | POA: Insufficient documentation

## 2023-01-12 DIAGNOSIS — R059 Cough, unspecified: Secondary | ICD-10-CM | POA: Diagnosis present

## 2023-01-12 LAB — SARS CORONAVIRUS 2 BY RT PCR: SARS Coronavirus 2 by RT PCR: NEGATIVE

## 2023-01-12 NOTE — ED Triage Notes (Signed)
Patient presents to ED via POV from home. Here with cough and fevers x 4 days.

## 2023-01-12 NOTE — ED Provider Notes (Signed)
[place] EMERGENCY DEPARTMENT AT [facility] Provider Note   CSN: [id number] Arrival date & time: 01/12/23  1509     History  Chief Complaint  Patient presents with   Cough    Miron Marxen is a 27 y.o. male.  With no significant past medical history presents to the ED for evaluation of URI type symptoms including congestion, body aches, cough, subjective fevers.  He had a subjective fever last night but has not had 1 today.  His cough is productive of yellow sputum.  He states it is difficult to breathe through his nose causing some shortness of breath.  He denies chest pain or hemoptysis.  He was exposed to a coworker who was sick just before symptoms started.  His symptoms began 5 days ago.  He states today is the best he has felt since his symptoms began.   Cough Associated symptoms: rhinorrhea        Home Medications Prior to Admission medications   Medication Sig Start Date End Date Taking? Authorizing Provider  metoCLOPramide (REGLAN) 10 MG tablet Take 1 tablet (10 mg total) by mouth every 6 (six) hours as needed for nausea or vomiting. 11/06/16   Trixie Dredge, PA-C  omeprazole (PRILOSEC) 20 MG capsule Take 1 capsule (20 mg total) by mouth daily. 11/06/16   Trixie Dredge, PA-C  sucralfate (CARAFATE) 1 g tablet Take 1 tablet (1 g total) by mouth 4 (four) times daily -  with meals and at bedtime. 11/06/16   Trixie Dredge, PA-C      Allergies    Patient has no known allergies.    Review of Systems   Review of Systems  HENT:  Positive for congestion and rhinorrhea.   Respiratory:  Positive for cough.   All other systems reviewed and are negative.   Physical Exam Updated Vital Signs BP (!) 114/91 (BP Location: Left Arm)   Pulse 67   Temp 98.8 F (37.1 C) (Oral)   Resp 18   Ht  (1.905 m)   Wt 79.4 kg   SpO2 98%   BMI 21.87 kg/m  Physical Exam Vitals and nursing note reviewed.  Constitutional:      General: He is not in acute distress.    Appearance:  Normal appearance. He is well-developed. He is not ill-appearing, toxic-appearing or diaphoretic.  HENT:     Head: Normocephalic and atraumatic.     Ears:     Comments: Bilateral TM effusions    Mouth/Throat:     Mouth: Mucous membranes are moist.     Pharynx: Oropharynx is clear. No oropharyngeal exudate or posterior oropharyngeal erythema.     Comments: Postnasal drip Eyes:     Conjunctiva/sclera: Conjunctivae normal.  Cardiovascular:     Rate and Rhythm: Normal rate and regular rhythm.     Heart sounds: No murmur heard. Pulmonary:     Effort: Pulmonary effort is normal. No respiratory distress.     Breath sounds: Normal breath sounds. No wheezing or rhonchi.  Abdominal:     Palpations: Abdomen is soft.     Tenderness: There is no abdominal tenderness.  Musculoskeletal:        General: No swelling.     Cervical back: Neck supple.  Skin:    General: Skin is warm and dry.     Capillary Refill: Capillary refill takes less than 2 seconds.  Neurological:     Mental Status: He is alert.  Psychiatric:  Mood and Affect: Mood normal.     ED Results / Procedures / Treatments   Labs (all labs ordered are listed, but only abnormal results are displayed) Labs Reviewed  SARS CORONAVIRUS 2 BY RT PCR    EKG None  Radiology No results found.  Procedures Procedures    Medications Ordered in ED Medications - No data to display  ED Course/ Medical Decision Making/ A&P                             Medical Decision Making This patient presents to the ED for concern of URI type symptoms, this involves an extensive number of treatment options, and is a complaint that carries with it a high risk of complications and morbidity.  The differential diagnosis includes flu, COVID, RSV, other viral URI  My initial workup includes COVID test  Additional history obtained from: Nursing notes from this visit.  I ordered, reviewed and interpreted labs which include: COVID test.   Negative.  Afebrile, hemodynamically stable.  27 year old male presenting to the ED for evaluation of URI type symptoms as described above.  He is on day 5 and is feeling better.  He appears overall very well.  He was sent here by work to rule out a communicable disease.  He is on day 5 of symptoms and is feeling better.  He was encouraged to quarantine through the rest of the day.  He has no adventitious breath sounds and is in no acute distress.  Likely a viral illness.  Low suspicion for emergent conditions or pneumonia.  He was educated on symptomatic treatment.  He was given return precautions.  He was given a work note. he was educated on typical course of symptoms.  Stable at discharge.  At this time there does not appear to be any evidence of an acute emergency medical condition and the patient appears stable for discharge with appropriate outpatient follow up. Diagnosis was discussed with patient who verbalizes understanding of care plan and is agreeable to discharge. I have discussed return precautions with patient and step mother who verbalizes understanding. Patient encouraged to follow-up with their PCP within 1 week. All questions answered.  Note: Portions of this report may have been transcribed using voice recognition software. Every effort was made to ensure accuracy; however, inadvertent computerized transcription errors may still be present.        Final Clinical Impression(s) / ED Diagnoses Final diagnoses:  Viral URI with cough    Rx / DC Orders ED Discharge Orders     None         Michelle Piper, Cordelia Poche 01/12/23 1629    Mardene Sayer, MD 01/12/23 [phone]

## 2023-01-12 NOTE — Discharge Instructions (Addendum)
You have been seen today for your complaint of upper respiratory infection. Your lab work was negative for COVID. Your discharge medications include Claritin-D and Flonase.  Follow dosing instructions on the packaging.  You may also take Tylenol and ibuprofen for fevers and bodyaches. Home care instructions are as follows:  Drink plenty of water.  Eat normal diet.  Quarantine through the rest of the day Follow up with: Your primary care provider in 1 week for reevaluation Please seek immediate medical care if you develop any of the following symptoms: You have shortness of breath that gets worse. You have severe or persistent: Headache. Ear pain. Sinus pain. Chest pain. You have chronic lung disease along with any of the following: Making high-pitched whistling sounds when you breathe, most often when you breathe out (wheezing). Prolonged cough (more than 14 days). Coughing up blood. A change in your usual mucus. You have a stiff neck. You have changes in your: Vision. Hearing. Thinking. Mood. At this time there does not appear to be the presence of an emergent medical condition, however there is always the potential for conditions to change. Please read and follow the below instructions.  Do not take your medicine if  develop an itchy rash, swelling in your mouth or lips, or difficulty breathing; call 911 and seek immediate emergency medical attention if this occurs.  You may review your lab tests and imaging results in their entirety on your MyChart account.  Please discuss all results of fully with your primary care provider and other specialist at your follow-up visit.  Note: Portions of this text may have been transcribed using voice recognition software. Every effort was made to ensure accuracy; however, inadvertent computerized transcription errors may still be present.

## 2023-08-18 ENCOUNTER — Emergency Department (HOSPITAL_COMMUNITY)
Admission: EM | Admit: 2023-08-18 | Discharge: 2023-08-18 | Disposition: A | Discharge status: Home / Self Care | Payer: 59 | Attending: Emergency Medicine | Admitting: Emergency Medicine

## 2023-08-18 ENCOUNTER — Encounter (HOSPITAL_COMMUNITY): Payer: Self-pay

## 2023-08-18 ENCOUNTER — Emergency Department (HOSPITAL_COMMUNITY): Payer: 59

## 2023-08-18 DIAGNOSIS — R7401 Elevation of levels of liver transaminase levels: Secondary | ICD-10-CM | POA: Diagnosis not present

## 2023-08-18 DIAGNOSIS — R1032 Left lower quadrant pain: Secondary | ICD-10-CM | POA: Diagnosis present

## 2023-08-18 DIAGNOSIS — R197 Diarrhea, unspecified: Secondary | ICD-10-CM | POA: Diagnosis not present

## 2023-08-18 HISTORY — DX: Disorder of kidney and ureter, unspecified: N28.9

## 2023-08-18 LAB — BASIC METABOLIC PANEL
Anion gap: 12 (ref 5–15)
BUN: 14 mg/dL (ref 6–20)
CO2: 20 mmol/L — ABNORMAL LOW (ref 22–32)
Calcium: 10 mg/dL (ref 8.9–10.3)
Chloride: 107 mmol/L (ref 98–111)
Creatinine, Ser: 1.1 mg/dL (ref 0.61–1.24)
GFR, Estimated: >60 mL/min (ref >60)
Glucose, Bld: 91 mg/dL (ref 70–99)
Potassium: 3.7 mmol/L (ref 3.5–5.1)
Sodium: 139 mmol/L (ref 135–145)

## 2023-08-18 LAB — CBC
HCT: 42.5 % (ref 39.0–52.0)
Hemoglobin: 14.9 g/dL (ref 13.0–17.0)
MCH: 30.8 pg (ref 26.0–34.0)
MCHC: 35.1 g/dL (ref 30.0–36.0)
MCV: 87.8 fL (ref 80.0–100.0)
Platelets: 249 10*3/uL (ref 150–400)
RBC: 4.84 MIL/uL (ref 4.22–5.81)
RDW: 11.5 % (ref 11.5–15.5)
WBC: 8 10*3/uL (ref 4.0–10.5)
nRBC: 0 % (ref 0.0–0.2)

## 2023-08-18 LAB — HEPATIC FUNCTION PANEL
ALT: 18 U/L (ref 0–44)
AST: 23 U/L (ref 15–41)
Albumin: 5 g/dL (ref 3.5–5.0)
Alkaline Phosphatase: 53 U/L (ref 38–126)
Bilirubin, Direct: <0.1 mg/dL (ref 0.0–0.2)
Total Bilirubin: 1.3 mg/dL — ABNORMAL HIGH (ref <1.2)
Total Protein: 7.9 g/dL (ref 6.5–8.1)

## 2023-08-18 LAB — URINALYSIS, ROUTINE W REFLEX MICROSCOPIC
Bilirubin Urine: NEGATIVE
Glucose, UA: NEGATIVE mg/dL
Hgb urine dipstick: NEGATIVE
Ketones, ur: 20 mg/dL — AB
Leukocytes,Ua: NEGATIVE
Nitrite: NEGATIVE
Protein, ur: NEGATIVE mg/dL
Specific Gravity, Urine: 1.027 (ref 1.005–1.030)
pH: 5 (ref 5.0–8.0)

## 2023-08-18 LAB — CBG MONITORING, ED: Glucose-Capillary: 81 mg/dL (ref 70–99)

## 2023-08-18 MED ORDER — MORPHINE SULFATE (PF) 4 MG/ML IV SOLN
4.0000 mg | Freq: Once | INTRAVENOUS | Status: AC
  Administered 2023-08-18: 4 mg via INTRAVENOUS
  Filled 2023-08-18: qty 1

## 2023-08-18 MED ORDER — IOHEXOL 300 MG/ML  SOLN
100.0000 mL | Freq: Once | INTRAMUSCULAR | Status: AC | PRN
  Administered 2023-08-18: 100 mL via INTRAVENOUS

## 2023-08-18 NOTE — Discharge Instructions (Addendum)
Your CT scan did not show any abnormal findings.  Your lab work is reassuring.  We checked your kidney and liver function which is normal.  Your electrolytes are normal.  It is unclear as to the exact cause of your abdominal pain at this time.  You may take up to 1000mg  of tylenol every 6 hours as needed for pain.  Do not take more then 4g per day.  You may use up to 800mg  ibuprofen every 8 hours as needed for pain.  Do not exceed 2.4g of ibuprofen per day.  Return the ER for any severe worsening of your abdominal pain, any fever or chills, inability to keep food or water down, any other new or concerning symptoms.

## 2023-08-18 NOTE — ED Triage Notes (Addendum)
Pt c/o diarrhea x1 week, dizziness x2-3 days, and LLQ pain starting this afternoon.  Pain score 6/10.   Pt reports lack of food intake d/t financial issues and increased stress d/t roommates medical issues.  Also, less than 4hrs sleep per night for the past week.

## 2023-08-18 NOTE — ED Provider Notes (Signed)
[place] EMERGENCY DEPARTMENT AT [facility] Provider Note   CSN: [id number] Arrival date & time: 08/18/23  1752     History  Chief Complaint  Patient presents with   Abdominal Pain   Dizziness   Diarrhea    Brian Frank is a 27 y.o. male with no significant past medical history presents with concern for left lower quadrant pain that started this morning.  States he initially thought he had over eaten, but the pain gradually worsened throughout the day.  He also reports nonbloody diarrhea for the past week.  Denies any fever or chills, nausea or vomiting.  Denies any previous abdominal surgeries.   Abdominal Pain Associated symptoms: diarrhea   Dizziness Associated symptoms: diarrhea   Diarrhea Associated symptoms: abdominal pain        Home Medications Prior to Admission medications   Medication Sig Start Date End Date Taking? Authorizing Provider  metoCLOPramide (REGLAN) 10 MG tablet Take 1 tablet (10 mg total) by mouth every 6 (six) hours as needed for nausea or vomiting. 11/06/16   Trixie Dredge, PA-C  omeprazole (PRILOSEC) 20 MG capsule Take 1 capsule (20 mg total) by mouth daily. 11/06/16   Trixie Dredge, PA-C  sucralfate (CARAFATE) 1 g tablet Take 1 tablet (1 g total) by mouth 4 (four) times daily -  with meals and at bedtime. 11/06/16   Trixie Dredge, PA-C      Allergies    Patient has no known allergies.    Review of Systems   Review of Systems  Gastrointestinal:  Positive for abdominal pain and diarrhea.    Physical Exam Updated Vital Signs BP 124/80   Pulse 71   Temp 98.3 F (36.8 C) (Oral)   Resp 20   Ht 6\' 3"  (1.905 m)   Wt 79.4 kg   SpO2 100%   BMI 21.87 kg/m  Physical Exam Vitals and nursing note reviewed.  Constitutional:      General: He is not in acute distress.    Appearance: Normal appearance. He is well-developed.     Comments: Holding left lower quadrant, no active emesis  HENT:     Head: Normocephalic and atraumatic.  Eyes:      Conjunctiva/sclera: Conjunctivae normal.  Cardiovascular:     Rate and Rhythm: Normal rate and regular rhythm.     Heart sounds: No murmur heard. Pulmonary:     Effort: Pulmonary effort is normal. No respiratory distress.     Breath sounds: Normal breath sounds.  Abdominal:     General: Bowel sounds are normal.     Palpations: Abdomen is soft.     Tenderness: There is no abdominal tenderness.     Comments: Left lower quadrant tenderness palpation, no rebound or guarding CVA tenderness bilaterally  Musculoskeletal:        General: No swelling.     Cervical back: Neck supple.  Skin:    General: Skin is warm and dry.     Capillary Refill: Capillary refill takes less than 2 seconds.  Neurological:     General: No focal deficit present.     Mental Status: He is alert.  Psychiatric:        Mood and Affect: Mood normal.        Behavior: Behavior normal.     ED Results / Procedures / Treatments   Labs (all labs ordered are listed, but only abnormal results are displayed) Labs Reviewed  BASIC METABOLIC PANEL - Abnormal; Notable for the following components:  Result Value   CO2 20 (*)    All other components within normal limits  URINALYSIS, ROUTINE W REFLEX MICROSCOPIC - Abnormal; Notable for the following components:   Ketones, ur 20 (*)    All other components within normal limits  HEPATIC FUNCTION PANEL - Abnormal; Notable for the following components:   Total Bilirubin 1.3 (*)    All other components within normal limits  CBC  CBG MONITORING, ED    EKG None  Radiology CT ABDOMEN PELVIS W CONTRAST  Result Date: 08/18/2023 CLINICAL DATA:  Left lower quadrant abdominal pain, diarrhea, dizziness EXAM: CT ABDOMEN AND PELVIS WITH CONTRAST TECHNIQUE: Multidetector CT imaging of the abdomen and pelvis was performed using the standard protocol following bolus administration of intravenous contrast. RADIATION DOSE REDUCTION: This exam was performed according to the  departmental dose-optimization program which includes automated exposure control, adjustment of the mA and/or kV according to patient size and/or use of iterative reconstruction technique. CONTRAST:  OMNIPAQUE IOHEXOL 300 MG/ML  SOLN COMPARISON:  CT 06/18/2016 FINDINGS: Lower chest: No acute abnormality. Hepatobiliary: Unremarkable liver. Normal gallbladder. No biliary dilation. Pancreas: Unremarkable. Spleen: Unremarkable. Adrenals/Urinary Tract: Normal adrenal glands. No urinary calculi or hydronephrosis. Bladder is unremarkable. Stomach/Bowel: Normal caliber large and small bowel. No bowel wall thickening. The appendix is normal.Stomach is within normal limits. Vascular/Lymphatic: No significant vascular findings are present. No enlarged abdominal or pelvic lymph nodes. Reproductive: Unremarkable. Other: No free intraperitoneal fluid or air. Musculoskeletal: No acute fracture. IMPRESSION: No acute abnormality in the abdomen or pelvis. Electronically Signed   By: Minerva Fester M.D.   On: 08/18/2023 20:59    Procedures Procedures    Medications Ordered in ED Medications  morphine (PF) 4 MG/ML injection 4 mg (4 mg Intravenous Given 08/18/23 1942)  iohexol (OMNIPAQUE) 300 MG/ML solution 100 mL (100 mLs Intravenous Contrast Given 08/18/23 1954)    ED Course/ Medical Decision Making/ A&P                                 Medical Decision Making Amount and/or Complexity of Data Reviewed Labs: ordered. Radiology: ordered.  Risk Prescription drug management.     Differential diagnosis includes but is not limited to diverticulitis, abdominal abscess, appendicitis, UTI  ED Course:  Patient with left lower quadrant upon initial evaluation.  He does have tenderness on the left lower quadrant.  Vital signs stable.  CBC without leukocytosis.  BMP was initially connected which is unremarkable.  I have added on hepatic function panel to evaluate for infection.  Urinalysis without signs of UTI.   CT abdomen pelvis was obtained which showed no acute abnormalities. Patient given IV morphine for pain  Upon re-evaluation, patient reports pain has greatly improved.  Hepatic function panel without any elevation in AST, ALT, alk phos.  Mild elevated bilirubin 1.3.  Feel he is appropriate for discharge home at this time given largely unremarkable lab workup and unremarkable CT scan.  Impression: Left lower quadrant abdominal pain  Disposition:  The patient was discharged home with instructions to take Tylenol and ibuprofen as needed for pain. Return precautions given.  Lab Tests: I Ordered, and personally interpreted labs.  The pertinent results include:   CBC without leukocytosis BMP unremarkable Hepatic function panel with slightly elevated bilirubin at 1.3 Urinalysis unremarkable  Imaging Studies ordered: I ordered imaging studies including CT abdomen pelvis I independently visualized the imaging with scope of interpretation limited to  determining acute life threatening conditions related to emergency care. Imaging showed no acute abnormalities I agree with the radiologist interpretation               Final Clinical Impression(s) / ED Diagnoses Final diagnoses:  Left lower quadrant abdominal pain    Rx / DC Orders ED Discharge Orders     None         Arabella Merles, Cordelia Poche 08/18/23 2117    Linwood Dibbles, MD 08/19/23 1515

## 2023-09-06 ENCOUNTER — Ambulatory Visit (HOSPITAL_COMMUNITY)
Admission: EM | Admit: 2023-09-06 | Discharge: 2023-09-06 | Disposition: A | Discharge status: Other Acute Inpatient Hospital | Payer: 59 | Attending: Psychiatry | Admitting: Psychiatry

## 2023-09-06 ENCOUNTER — Other Ambulatory Visit: Payer: Self-pay

## 2023-09-06 ENCOUNTER — Encounter (HOSPITAL_COMMUNITY): Payer: Self-pay | Admitting: Behavioral Health

## 2023-09-06 ENCOUNTER — Inpatient Hospital Stay (HOSPITAL_COMMUNITY)
Admission: AD | Admit: 2023-09-06 | Discharge: 2023-09-11 | DRG: 885 | Disposition: A | Discharge status: Home / Self Care | Payer: Commercial Managed Care - PPO | Source: Intra-hospital | Attending: Psychiatry | Admitting: Psychiatry

## 2023-09-06 DIAGNOSIS — Z608 Other problems related to social environment: Secondary | ICD-10-CM | POA: Diagnosis present

## 2023-09-06 DIAGNOSIS — F17200 Nicotine dependence, unspecified, uncomplicated: Secondary | ICD-10-CM | POA: Insufficient documentation

## 2023-09-06 DIAGNOSIS — F332 Major depressive disorder, recurrent severe without psychotic features: Secondary | ICD-10-CM | POA: Diagnosis not present

## 2023-09-06 DIAGNOSIS — Z818 Family history of other mental and behavioral disorders: Secondary | ICD-10-CM

## 2023-09-06 DIAGNOSIS — F17210 Nicotine dependence, cigarettes, uncomplicated: Secondary | ICD-10-CM | POA: Diagnosis present

## 2023-09-06 DIAGNOSIS — F1290 Cannabis use, unspecified, uncomplicated: Secondary | ICD-10-CM | POA: Diagnosis present

## 2023-09-06 DIAGNOSIS — F17290 Nicotine dependence, other tobacco product, uncomplicated: Secondary | ICD-10-CM | POA: Diagnosis present

## 2023-09-06 DIAGNOSIS — Z9151 Personal history of suicidal behavior: Secondary | ICD-10-CM | POA: Diagnosis not present

## 2023-09-06 DIAGNOSIS — Z62810 Personal history of physical and sexual abuse in childhood: Secondary | ICD-10-CM | POA: Diagnosis not present

## 2023-09-06 DIAGNOSIS — F322 Major depressive disorder, single episode, severe without psychotic features: Secondary | ICD-10-CM

## 2023-09-06 DIAGNOSIS — R45851 Suicidal ideations: Secondary | ICD-10-CM | POA: Diagnosis present

## 2023-09-06 DIAGNOSIS — Z79899 Other long term (current) drug therapy: Secondary | ICD-10-CM | POA: Diagnosis not present

## 2023-09-06 DIAGNOSIS — F411 Generalized anxiety disorder: Secondary | ICD-10-CM | POA: Diagnosis present

## 2023-09-06 DIAGNOSIS — Z9152 Personal history of nonsuicidal self-harm: Secondary | ICD-10-CM | POA: Diagnosis not present

## 2023-09-06 DIAGNOSIS — Z5986 Financial insecurity: Secondary | ICD-10-CM

## 2023-09-06 LAB — COMPREHENSIVE METABOLIC PANEL
ALT: 15 U/L (ref 0–44)
AST: 17 U/L (ref 15–41)
Albumin: 4.5 g/dL (ref 3.5–5.0)
Alkaline Phosphatase: 51 U/L (ref 38–126)
Anion gap: 9 (ref 5–15)
BUN: 14 mg/dL (ref 6–20)
CO2: 25 mmol/L (ref 22–32)
Calcium: 9.7 mg/dL (ref 8.9–10.3)
Chloride: 105 mmol/L (ref 98–111)
Creatinine, Ser: 0.99 mg/dL (ref 0.61–1.24)
GFR, Estimated: >60 mL/min (ref >60)
Glucose, Bld: 90 mg/dL (ref 70–99)
Potassium: 4.4 mmol/L (ref 3.5–5.1)
Sodium: 139 mmol/L (ref 135–145)
Total Bilirubin: 1.1 mg/dL (ref <1.2)
Total Protein: 7.4 g/dL (ref 6.5–8.1)

## 2023-09-06 LAB — CBC WITH DIFFERENTIAL/PLATELET
Abs Immature Granulocytes: 0.01 10*3/uL (ref 0.00–0.07)
Basophils Absolute: 0.1 10*3/uL (ref 0.0–0.1)
Basophils Relative: 1 %
Eosinophils Absolute: 0 10*3/uL (ref 0.0–0.5)
Eosinophils Relative: 1 %
HCT: 42.2 % (ref 39.0–52.0)
Hemoglobin: 14.6 g/dL (ref 13.0–17.0)
Immature Granulocytes: 0 %
Lymphocytes Relative: 24 %
Lymphs Abs: 1.2 10*3/uL (ref 0.7–4.0)
MCH: 31.1 pg (ref 26.0–34.0)
MCHC: 34.6 g/dL (ref 30.0–36.0)
MCV: 90 fL (ref 80.0–100.0)
Monocytes Absolute: 0.3 10*3/uL (ref 0.1–1.0)
Monocytes Relative: 5 %
Neutro Abs: 3.4 10*3/uL (ref 1.7–7.7)
Neutrophils Relative %: 69 %
Platelets: 255 10*3/uL (ref 150–400)
RBC: 4.69 MIL/uL (ref 4.22–5.81)
RDW: 11.9 % (ref 11.5–15.5)
WBC: 4.9 10*3/uL (ref 4.0–10.5)
nRBC: 0 % (ref 0.0–0.2)

## 2023-09-06 LAB — HEMOGLOBIN A1C
Hgb A1c MFr Bld: 5.1 % (ref 4.8–5.6)
Mean Plasma Glucose: 99.67 mg/dL

## 2023-09-06 LAB — POCT URINE DRUG SCREEN - MANUAL ENTRY (I-SCREEN)
POC Amphetamine UR: NOT DETECTED
POC Buprenorphine (BUP): NOT DETECTED
POC Cocaine UR: NOT DETECTED
POC Marijuana UR: POSITIVE — AB
POC Methadone UR: NOT DETECTED
POC Methamphetamine UR: NOT DETECTED
POC Morphine: NOT DETECTED
POC Oxazepam (BZO): NOT DETECTED
POC Oxycodone UR: NOT DETECTED
POC Secobarbital (BAR): NOT DETECTED

## 2023-09-06 LAB — ETHANOL: Alcohol, Ethyl (B): <10 mg/dL (ref <10)

## 2023-09-06 LAB — LIPID PANEL
Cholesterol: 166 mg/dL (ref 0–200)
HDL: 49 mg/dL (ref >40)
LDL Cholesterol: 107 mg/dL — ABNORMAL HIGH (ref 0–99)
Total CHOL/HDL Ratio: 3.4 {ratio}
Triglycerides: 50 mg/dL (ref <150)
VLDL: 10 mg/dL (ref 0–40)

## 2023-09-06 LAB — HIV ANTIBODY (ROUTINE TESTING W REFLEX): HIV Screen 4th Generation wRfx: NONREACTIVE

## 2023-09-06 LAB — TSH: TSH: 0.743 u[IU]/mL (ref 0.350–4.500)

## 2023-09-06 MED ORDER — TRAZODONE HCL 50 MG PO TABS
50.0000 mg | ORAL_TABLET | Freq: Every evening | ORAL | Status: DC | PRN
  Administered 2023-09-07 – 2023-09-10 (×3): 50 mg via ORAL
  Filled 2023-09-06 (×3): qty 1

## 2023-09-06 MED ORDER — HYDROXYZINE HCL 25 MG PO TABS: 25.0000 mg | ORAL_TABLET | Freq: Three times a day (TID) | ORAL | Status: DC | PRN

## 2023-09-06 MED ORDER — HALOPERIDOL LACTATE 5 MG/ML IJ SOLN: 10.0000 mg | Freq: Three times a day (TID) | INTRAMUSCULAR | Status: DC | PRN

## 2023-09-06 MED ORDER — MAGNESIUM HYDROXIDE 400 MG/5ML PO SUSP: 30.0000 mL | Freq: Every day | ORAL | Status: DC | PRN

## 2023-09-06 MED ORDER — HALOPERIDOL 5 MG PO TABS
5.0000 mg | ORAL_TABLET | Freq: Three times a day (TID) | ORAL | Status: DC | PRN
Start: 2023-09-06 — End: 2023-09-11

## 2023-09-06 MED ORDER — LORAZEPAM 2 MG/ML IJ SOLN
2.0000 mg | Freq: Three times a day (TID) | INTRAMUSCULAR | Status: DC | PRN
Start: 2023-09-06 — End: 2023-09-11

## 2023-09-06 MED ORDER — DIPHENHYDRAMINE HCL 50 MG/ML IJ SOLN: 50.0000 mg | Freq: Three times a day (TID) | INTRAMUSCULAR | Status: DC | PRN

## 2023-09-06 MED ORDER — HYDROXYZINE HCL 25 MG PO TABS
25.0000 mg | ORAL_TABLET | Freq: Three times a day (TID) | ORAL | Status: DC | PRN
Start: 2023-09-06 — End: 2023-09-11
  Filled 2023-09-06: qty 1

## 2023-09-06 MED ORDER — DIPHENHYDRAMINE HCL 50 MG/ML IJ SOLN
50.0000 mg | Freq: Three times a day (TID) | INTRAMUSCULAR | Status: DC | PRN
Start: 2023-09-06 — End: 2023-09-11

## 2023-09-06 MED ORDER — ALUM & MAG HYDROXIDE-SIMETH 200-200-20 MG/5ML PO SUSP: 30.0000 mL | ORAL | Status: DC | PRN

## 2023-09-06 MED ORDER — TRAZODONE HCL 50 MG PO TABS: 50.0000 mg | ORAL_TABLET | Freq: Every evening | ORAL | Status: DC | PRN

## 2023-09-06 MED ORDER — ACETAMINOPHEN 325 MG PO TABS: 650.0000 mg | ORAL_TABLET | Freq: Four times a day (QID) | ORAL | Status: DC | PRN

## 2023-09-06 MED ORDER — NICOTINE POLACRILEX 2 MG MT GUM
2.0000 mg | CHEWING_GUM | OROMUCOSAL | Status: DC
  Administered 2023-09-06 – 2023-09-11 (×18): 2 mg via ORAL
  Filled 2023-09-06 (×35): qty 1

## 2023-09-06 MED ORDER — HALOPERIDOL LACTATE 5 MG/ML IJ SOLN: 5.0000 mg | Freq: Three times a day (TID) | INTRAMUSCULAR | Status: DC | PRN

## 2023-09-06 MED ORDER — DIPHENHYDRAMINE HCL 25 MG PO CAPS: 50.0000 mg | ORAL_CAPSULE | Freq: Three times a day (TID) | ORAL | Status: DC | PRN

## 2023-09-06 MED ORDER — OLANZAPINE 5 MG PO TBDP: 5.0000 mg | ORAL_TABLET | Freq: Three times a day (TID) | ORAL | Status: DC | PRN

## 2023-09-06 NOTE — [facility] Group Notes (Signed)
[facility] Group Notes:  (Nursing/MHT/Case Management/Adjunct)  Date:  09/06/2023  Time:  5:41 PM  Type of Therapy:  Music Therapy  Participation Level:  Active  Participation Quality:  Appropriate  Affect:  Appropriate  Cognitive:  Appropriate  Insight:  Appropriate  Engagement in Group:  Engaged  Modes of Intervention:  Exploration  Summary of Progress/Problems:  Brian Frank 09/06/2023, 5:41 PM

## 2023-09-06 NOTE — ED Notes (Signed)
Patient is pleasant, calm, and cooperative. He endorses chronic SI and self harm thoughts. When asked if pt has a plan, he states "off the top of my head, I would probably bite my tongue off". He denies HI and AVH. Skin check conducted by this nurse and Annice Pih, MHT. Patient's belongings placed in a locker. Pt oriented to the unit. Meal and beverage offered but patient declined. He denies physical pain or discomfort. No additional needs noted at this time. We will continue to monitor for safety.

## 2023-09-06 NOTE — Discharge Instructions (Signed)
Patient accepted to [facility].

## 2023-09-06 NOTE — ED Provider Notes (Signed)
[facility]al [facility] Urgent Care Medical Screening Exam  Patient Name: Brian Frank MRN: [id number] Date of Evaluation: 09/06/23 Chief Complaint:  SI Diagnosis:  Final diagnoses:  Current severe episode of major depressive disorder without psychotic features, unspecified whether recurrent (HCC)  Suicidal ideation    History of Present illness: Brian Frank is a 27 y.o. male with no reported past psychiatric history who presented to the [facility] voluntary with complaints of suicidal ideations.   Patient states that he has been thinking about ending his life and that he put a knife to his throat this morning when no one was around and states that he could not convince him self to go through with it. He also reports having thoughts this morning to jump off a bridge in front of the biggest vehicle to ensure that he would kill himself.  He reports having suicidal ideation constantly every day since he was 27 years old. He states that the initial suicidal ideations were triggered by him having to go live with his biological mother when he was 69 years old. He states that from the time he was born up until he was 27 years old he stayed with his grandparents because his mother was a teen mom.   He states that this morning when his roommate's family came over to pack up his roommate's belongings he expressed to them how he was feeling.  He states that he's put a knife up to his neck in a suicide attempt several times starting at age 48 years old.   Patient reports feeling depressed all his life. He endorses depressive symptoms of decreased energy, decreased motivation, sadness, hopelessness, worthlessness, dissociating, isolating, poor sleep, sleeping too little or too much and poor appetite.  Patient denies homicidal ideations. Patient denies auditory or visual hallucinations. There is no objective evidence that the patient is currently responding to internal or external  stimuli. Patient reports drinking alcohol socially and states that he is not a social person so he barely drinks. He states that he last consumed alcohol last Sunday. He denies using illicit drugs.    Patient resides with a friend/roommate for the past 2 years. He states that his roommate moved back home because they were unable to keep up with the rent. He states that tomorrow is the last day and most likely they would be served with an eviction notice. 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  He denies a psychiatric history and states that he has never been diagnosed but has felt depressed since age 55 years old. He denies past inpatient psychiatric hospitalizations. He reported hx of therapy at age 62 and states that he was not formally diagnosed. He reports a family psychiatric history of, mother has a history of dissociative identity and depression.   Flowsheet Row ED from 09/06/2023 in [facility] ED from 08/18/2023 in [facility] Emergency Department at [facility] ED from 01/12/2023 in [facility] Emergency Department at [facility]  C-SSRS RISK CATEGORY High Risk No Risk No Risk       Psychiatric Specialty Exam  Presentation  General Appearance:Disheveled  Eye Contact:Poor  Speech:Clear and Coherent  Speech Volume:Normal   Mood and Affect  Mood: Depressed  Affect: Inappropriate   Thought Process  Thought Processes: Coherent  Descriptions of Associations:Intact  Orientation:Full (Time, Place and Person)  Thought Content:Logical  Diagnosis  of Schizophrenia or Schizoaffective disorder in past: No   Hallucinations:None  Ideas of Reference:None  Suicidal Thoughts:Yes, Active  Homicidal Thoughts:No   Sensorium  Memory: Immediate Fair; Recent Fair; Remote Fair  Judgment: Poor  Insight: Poor   Executive Functions   Concentration: Fair  Attention Span: Fair  Recall: [employer] of Knowledge: Fair  Language: Fair   Psychomotor Activity  Psychomotor Activity: Normal   Assets  Assets: Manufacturing systems engineer; Desire for Improvement; Leisure Time; Physical Health   Sleep  Sleep: Poor  Number of hours: No data recorded  Physical Exam: Physical Exam HENT:     Nose: Nose normal.  Eyes:     Conjunctiva/sclera: Conjunctivae normal.  Cardiovascular:     Rate and Rhythm: Normal rate.  Pulmonary:     Effort: Pulmonary effort is normal.  Musculoskeletal:        General: Normal range of motion.     Cervical back: Normal range of motion.  Neurological:     Mental Status: He is alert and oriented to person, place, and time.    Review of Systems  Constitutional: Negative.   HENT: Negative.    Eyes: Negative.   Respiratory: Negative.    Cardiovascular: Negative.   Gastrointestinal: Negative.   Genitourinary: Negative.   Musculoskeletal: Negative.   Neurological: Negative.   Endo/Heme/Allergies: Negative.    Blood pressure (!) 137/90, pulse 73, temperature 98.5 F (36.9 C), temperature source Oral, resp. rate 17, SpO2 100%. There is no height or weight on file to calculate BMI.  Musculoskeletal: Strength & Muscle Tone: within normal limits Gait & Station: normal Patient leans: N/A   [facility] MSE Discharge Disposition for Follow up and Recommendations: Based on my evaluation I certify that psychiatric inpatient services furnished can reasonably be expected to improve the patient's condition which I recommend transfer to an appropriate accepting facility. Patient accepted to [facility] today. Admission orders placed for [facility]. EMTALA completed.    Lab Orders         CBC with Differential/Platelet         Comprehensive metabolic panel         Hemoglobin A1c         Ethanol         Lipid panel         TSH         RPR         HIV Antibody (routine testing w rflx)          POCT Urine Drug Screen - (I-Screen)    EKG   Meds ordered this encounter  Medications   acetaminophen (TYLENOL) tablet 650 mg   alum & mag hydroxide-simeth (MAALOX/MYLANTA) 200-200-20 MG/5ML suspension 30 mL   magnesium hydroxide (MILK OF MAGNESIA) suspension 30 mL   hydrOXYzine (ATARAX) tablet 25 mg   traZODone (DESYREL) tablet 50 mg   OLANZapine zydis (ZYPREXA) disintegrating tablet 5 mg     Javaya [place] L, NP 09/06/2023, 12:46 PM

## 2023-09-06 NOTE — ED Notes (Signed)
Pt discharged in no acute distress. At the time of discharge pt endorses passive yet chronic SI with no specific plan. Pt's belongings returned from locker. Pt walked to the back [employer] by staff for transport to [facility] via [employer]. Safety maintained.

## 2023-09-06 NOTE — BH Assessment (Addendum)
Comprehensive Clinical Assessment (CCA) Note  09/06/2023 Brian Frank [id number]  DISPOSITION: Per White NP patient meets inpatient criteria as bed placement is investigated. Patient will be monitored in continuous assessment.    The patient demonstrates the following risk factors for suicide: Chronic risk factors for suicide include: psychiatric disorder of depression . Acute risk factors for suicide include:  depression . Protective factors for this patient include: coping skills. Considering these factors, the overall suicide risk at this point appears to be high. Patient is not appropriate for outpatient follow up.   Patient is a 27 year old male that presents this date voluntary with chronic S/I. Patient states he, "has been thinking about ending his life for years." Patient states he has never acted on any impulses to harm himself although reported he had, "a knife to his throat," earlier this date after being confronted by his roommates family who were, "trying to do an intervention." Patient states they came over earlier this date to try and " convince him to get some help," since they believe that patient has, "undiagnosed mental health problems." Patient states he agrees and feels he has been, "suffering from depression for years.'   Patient denies any H/I or AVH. Patient denies any SA issues. Patient denies an "official" mental health diagnosis although states he met with a therapist once at age 31 who reported he had MDD and possibly BPD although patient states he never followed up with medication management or therapy. 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 Patient states he, "over processes everything and often has racing thoughts at night because he dwells on the little things." Patient as mentioned above, was seen by a therapist at age 6 when he first start having theses feelings although was never provided with a diagnosis. Patient states he never followed up with therapy or medication management because he, "didn't see the point." Patient reports he has had chronic feelings associated with ongoing S/I although states he has never acted on any impulses to harm himself. 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  Patient denies access to weapons or current legal charges. Patient is cooperative, alert and oriented x 5. Patient speaks in a low soft voice and makes limited eye contact. Patient's memory appears to be intact with thoughts organized although he requires redirect at times during the assessment. Patient's mood is depressed with affect congruent. Patient does not appear to be responding to internal stimuli. Patient is open to an inpatient admission to assist with stabilization.  Chief Complaint: No chief complaint on file.  Visit Diagnosis: MDD recurrent without psychotic features, severe    CCA Screening, Triage and Referral (STR)  Patient Reported Information How did you hear about [place]? Self  What Is the Reason for Your Visit/Call  Today? Patient is a 27 year old male that presents voluntary this date as a walk in to [facility] voicing ongoing passive/chronic S/I. Patient was noted to have contacted this writer earlier by phone this date stating he was, "afraid to go out of his residence," because he has had thoughts of "jumping off a bridge," although stated at that time he had no intentions of acting on those thoughts. Patient was advised to contact emergency services to transport him to this location if he felt he was in crisis. Patient denies any H/I or AVH at the time of triage. Patient continues to voice ongoing thoughts of self harm although is vague in reference to a plan.  How Long Has This Been Causing You Problems? > than 6 months  What Do You Feel Would Help You the Most Today? Treatment for Depression or other mood problem   Have You Recently Had Any Thoughts About Hurting Yourself? Yes  Are You Planning to Commit Suicide/Harm Yourself At This time? No   Flowsheet Row ED from 09/06/2023 in [facility] ED from 08/18/2023 in [facility] Emergency Department at [facility] ED from 01/12/2023 in [facility] Emergency Department at [facility]  C-SSRS RISK CATEGORY High Risk No Risk No Risk       Have you Recently Had Thoughts About Hurting Someone Karolee Ohs? No  Are You Planning to Harm Someone at This Time? No  Explanation: NA   Have You Used Any Alcohol or Drugs in the Past 24 Hours? No  What Did You Use and How Much? NA  Do You Currently Have a Therapist/Psychiatrist? No Name of Therapist/Psychiatrist: Name of Therapist/Psychiatrist: NA Patient does not currently have OP services   Have You Been Recently Discharged From Any Office Practice or Programs? No  Explanation of Discharge From Practice/Program: NA     CCA Screening Triage Referral Assessment Type of Contact: Face-to-Face  Telemedicine Service Delivery:  09/06/2023 Is this Initial or Reassessment?  initial  Date Telepsych consult ordered in [facility]:   09/06/2023 Time Telepsych consult ordered in [facility]:   1200 Location of Assessment: [facility] Assessment Services  Provider Location: [facility] Assessment Services   Collateral Involvement: None at this time   Does Patient Have a Automotive engineer Guardian? No  Legal Guardian Contact Information: NA  Copy of Legal Guardianship Form: -- (NA)  Legal Guardian Notified of Arrival: -- (NA)  Legal Guardian Notified of Pending Discharge: -- (NA)  If Minor and Not Living with Parent(s), Who has Custody? NA  Is CPS involved or ever been involved? Never  Is APS involved or ever been involved? Never   Patient Determined To Be At Risk for Harm To Self or Others Based on Review of Patient Reported Information or Presenting Complaint? Yes, for Self-Harm  Method: No Plan  Availability of Means: No access or NA  Intent: Vague intent or NA  Notification Required: No need or identified person  Additional Information for Danger to Others Potential: -- (NA)  Additional Comments for Danger to Others Potential: NA  Are There Guns or Other Weapons in Your Home? No  Types of Guns/Weapons: NA  Are These Weapons Safely Secured?                            -- (  NA)  Who Could Verify You Are Able To Have These Secured: NA  Do You Have any Outstanding Charges, Pending Court Dates, Parole/Probation? Pt denies  Contacted To Inform of Risk of Harm To Self or Others: Other: Comment (NA)    Does Patient Present under Involuntary Commitment? No    [place] of Residence: [place]   Patient Currently Receiving the Following Services: Not Receiving Services   Determination of Need: Urgent (48 hours)   Options For Referral: Inpatient Hospitalization     CCA Biopsychosocial Patient Reported Schizophrenia/Schizoaffective Diagnosis in Past: No   Strengths: Pt is willing to participate in treatment   Mental Health Symptoms Depression:   Change in energy/activity; Difficulty Concentrating; Hopelessness; Irritability   Duration of Depressive symptoms: Duration of Depressive Symptoms: Greater than two weeks   Mania:  None   Anxiety:   Difficulty concentrating; Irritability   Psychosis:  None   Duration of Psychotic symptoms:    Trauma:  None   Obsessions:  None   Compulsions:  None   Inattention:  None   Hyperactivity/Impulsivity:  None   Oppositional/Defiant Behaviors:  None   Emotional Irregularity:  Chronic feelings of emptiness   Other Mood/Personality Symptoms:  None noted    Mental Status Exam Appearance and self-care  Stature:  Average   Weight:  Average weight   Clothing:  Disheveled   Grooming:  Neglected   Cosmetic use:  None   Posture/gait:  Normal   Motor activity:  Not Remarkable   Sensorium  Attention:  Distractible   Concentration:  Normal   Orientation:  X5   Recall/memory:  Normal   Affect and Mood  Affect:  Depressed   Mood:  Depressed; Anxious   Relating  Eye contact:  Fleeting   Facial expression:  Depressed; Anxious   Attitude toward examiner:  Cooperative   Thought and Language  Speech flow: Clear and Coherent   Thought content:  Appropriate to Mood and Circumstances   Preoccupation:  None   Hallucinations:  None   Organization:  Goal-directed; Intact   [employer] of Knowledge:  Fair   Intelligence:  Above [employer]:  Normal   Judgement:  Fair   Brewing technologist   Insight:  Fair   Decision Making:  Normal   Social Functioning  Social Maturity:  Responsible   Social Judgement:  Normal   Stress  Stressors:  Transitions   Coping Ability:  Human resources officer Deficits:  Activities of daily living   Supports:  Usual     Religion: Religion/Spirituality Are You A Religious Person?: No How Might This Affect Treatment?: NA  Leisure/Recreation: Leisure / Recreation Do You Have Hobbies?:  No  Exercise/Diet: Exercise/Diet Do You Exercise?: No Have You Gained or Lost A Significant Amount of Weight in the Past Six Months?: No Do You Follow a Special Diet?: No Do You Have Any Trouble Sleeping?: Yes Explanation of Sleeping Difficulties: Pt states he only has been sleeping 3 to 4 hours a night for the last 6 months   CCA Employment/Education Employment/Work Situation: Employment / Work Situation Employment Situation: Employed Work Stressors: Pt states he has not been at work for the last week due to lack of motivation Patient's Job has Been Impacted by Current Illness: Yes Describe how Patient's Job has Been Impacted: Pt states he isn't motivated to go to work anymore Has Patient ever Been in Equities trader?: No  Education: Education Is Patient Currently Attending School?: No  Last Grade Completed: 12 Did You Attend College?: No Did You Have An Individualized Education Program (IIEP): No Did You Have Any Difficulty At School?: No Patient's Education Has Been Impacted by Current Illness: No   CCA Family/Childhood History Family and Relationship History: Family history Marital status: Single Does patient have children?: No  Childhood History:  Childhood History By whom was/is the patient raised?: Both parents Did patient suffer any verbal/emotional/physical/sexual abuse as a child?: No Did patient suffer from severe childhood neglect?: No Has patient ever been sexually abused/assaulted/raped as an adolescent or adult?: No Was the patient ever a victim of a crime or a disaster?: No Witnessed domestic violence?: No Has patient been affected by domestic violence as an adult?: No       CCA Substance Use Alcohol/Drug Use: Alcohol / Drug Use Pain Medications: See MAR Prescriptions: See MAR Over the Counter: See MAR History of alcohol / drug use?: No history of alcohol / drug abuse Longest period of sobriety (when/how long): NA Negative Consequences of Use:   (NA) Withdrawal Symptoms:  (NA)                         ASAM's:  Six Dimensions of Multidimensional Assessment  Dimension 1:  Acute Intoxication and/or Withdrawal Potential:   Dimension 1:  Description of individual's past and current experiences of substance use and withdrawal: NA  Dimension 2:  Biomedical Conditions and Complications:   Dimension 2:  Description of patient's biomedical conditions and  complications: NA  Dimension 3:  Emotional, Behavioral, or Cognitive Conditions and Complications:  Dimension 3:  Description of emotional, behavioral, or cognitive conditions and complications: NA  Dimension 4:  Readiness to Change:  Dimension 4:  Description of Readiness to Change criteria: NA  Dimension 5:  Relapse, Continued use, or Continued Problem Potential:  Dimension 5:  Relapse, continued use, or continued problem potential critiera description: NA  Dimension 6:  Recovery/Living Environment:  Dimension 6:  Recovery/Iiving environment criteria description: NA  ASAM Severity Score:    ASAM Recommended Level of Treatment: ASAM Recommended Level of Treatment:  (NA)   Substance use Disorder (SUD) Substance Use Disorder (SUD)  Checklist Symptoms of Substance Use:  (NA)  Recommendations for Services/Supports/Treatments: Recommendations for Services/Supports/Treatments Recommendations For Services/Supports/Treatments:  (NA)  Discharge Disposition:    DSM5 Diagnoses: There are no active problems to display for this patient.    Referrals to Alternative Service(s): Referred to Alternative Service(s):   Place:   Date:   Time:    Referred to Alternative Service(s):   Place:   Date:   Time:    Referred to Alternative Service(s):   Place:   Date:   Time:    Referred to Alternative Service(s):   Place:   Date:   Time:     MESIYAH COOPRIDER, LCAS

## 2023-09-06 NOTE — Progress Notes (Addendum)
Pt was accepted to [facility] TODAY 09/06/2023; Bed Assignment 305-2 PENDING Signed Voluntary consent uploaded to pt's chart or faxed to [facility] [phone] or [phone]  Pt meets inpatient criteria per Loreen Freud  Attending Physician will be Dr. Sarita Bottom, MD   Report can be called to: -Adult unit: [phone]  Pt can arrive after: [facility] to coordinate with care team.  Care Team notified: [facility] Lona Kettle, Georgiann Hahn, Patrice White,NP,Valerie Reiser,RN   [place], [place] 09/06/2023 @ 2:55 PM

## 2023-09-06 NOTE — Plan of Care (Signed)

## 2023-09-06 NOTE — Progress Notes (Signed)
Patient is alert and oriented X4. Patient reports mild anxiety and depression.  . Pt  reports pain on his and, patient had punched the wall about a week ago, he had some bruises on knuckles.     Scheduled Medication administered  per MD orders. Support and encouragement provided. No adverse reaction to medications.    Pt is sociable with peers and staff. Pt is very hopeful and glad he came in , so he can get some coping strategies.  Will keep monitoring patient throughout shift

## 2023-09-06 NOTE — Progress Notes (Signed)
   09/06/23 1134  [facility] Triage Screening (Walk-ins at [facility] only)  How Did You Hear About [place]? Self  What Is the Reason for Your Visit/Call Today? Patient is a 27 year old male that presents this date voluntary with chronic S/I. Patient states he, "has been thinking about ending his life for years." Patient states he has never acted on any impulses to harm himself although reported he had, "a knife to his throat," earlier this date after being confronted by his roommates family who were, "trying to do an intervention." Patient states they came over earlier this date to try and " convince him to get some help," for what he believes is ongoing depression. Patient denies any H/I or AVH. Patient denies any SA issues. Patient denies an "official" mental health diagnosis although states he met with a therapist once at age 54 who reported he had MDD and possibly BPD although patient states he never followed up with medication management or therapy. 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  How Long Has This Been Causing You Problems? > than 6 months  Have You Recently Had Any Thoughts About Hurting Yourself? Yes  How long ago did you have thoughts about hurting yourself? earlier this date patient reports he had a knife to his neck  Are You Planning to Commit Suicide/Harm Yourself At This time? No  Have you Recently Had Thoughts About Hurting Someone Karolee Ohs? No  Are You Planning To Harm Someone At This Time? No  Physical Abuse Denies  Verbal Abuse Denies  Sexual Abuse Denies  Exploitation of patient/patient's resources Denies  Self-Neglect Denies   Possible abuse reported to: Other (Comment) (NA)  Are you currently experiencing any auditory, visual or other hallucinations? No  Have You Used Any Alcohol or Drugs in the Past 24 Hours? No  Do you have any current medical co-morbidities that require immediate attention? No  Clinician description of patient physical appearance/behavior: Pt presents with a depressed affect  What Do You Feel Would Help You the Most Today? Treatment for Depression or other mood problem  If access to [facility] was not available, would you have sought care in the Emergency Department? No  Determination of Need Urgent (48 hours)  Options For Referral  (Pt is recommended for continuious assessment)  Determination of Need filed? Yes

## 2023-09-06 NOTE — Progress Notes (Signed)
Admission Note:  Pt is a  13 y male admitted from [facility] with MDD voluntary. At time of admission pt was pleasant denying HI or AVH.  Pt states that his roommate "is moving out and I know I am going to loose were I stay".  Pt reports threatening to cut his own neck with a knife.  He was searched and skin assessment performed with no contraband being found.   His belongings were locked in locker #44 and pt was brought onto the unit.  He was oriented to room and milieu and Q checks started for safety.

## 2023-09-06 NOTE — [facility] Group Notes (Signed)
[facility] Group Notes:  (Nursing/MHT/Case Management/Adjunct)  Date:  09/06/2023  Time:  2015  Type of Therapy:   Wrap up group  Participation Level:  Active  Participation Quality:  Appropriate, Attentive, Sharing, and Supportive  Affect:  Depressed  Cognitive:  Alert  Insight:  Improving  Engagement in Group:  Engaged  Modes of Intervention:  Clarification, Education, and Support  Summary of Progress/Problems:Positive thinking and positive change were discussed.   Marcille Buffy 09/06/2023, 9:03 PM

## 2023-09-07 DIAGNOSIS — F17200 Nicotine dependence, unspecified, uncomplicated: Secondary | ICD-10-CM | POA: Insufficient documentation

## 2023-09-07 DIAGNOSIS — F411 Generalized anxiety disorder: Secondary | ICD-10-CM | POA: Insufficient documentation

## 2023-09-07 DIAGNOSIS — F1290 Cannabis use, unspecified, uncomplicated: Secondary | ICD-10-CM | POA: Insufficient documentation

## 2023-09-07 LAB — RPR: RPR Ser Ql: NONREACTIVE

## 2023-09-07 MED ORDER — SERTRALINE HCL 50 MG PO TABS
50.0000 mg | ORAL_TABLET | Freq: Every day | ORAL | Status: DC
  Administered 2023-09-07 – 2023-09-08 (×2): 50 mg via ORAL
  Filled 2023-09-07 (×4): qty 1

## 2023-09-07 MED ORDER — MELATONIN 3 MG PO TABS
3.0000 mg | ORAL_TABLET | Freq: Every day | ORAL | Status: DC
  Administered 2023-09-07 – 2023-09-09 (×3): 3 mg via ORAL
  Filled 2023-09-07 (×4): qty 1
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
## 2023-09-07 NOTE — B[facility] Suicide Risk Assessment (Signed)
[facility] INPATIENT:  Family/Significant Other Suicide Prevention Education  Suicide Prevention Education:  Patient Refusal for Family/Significant Other Suicide Prevention Education: The patient Brian Frank has refused to provide written consent for family/significant other to be provided Family/Significant Other Suicide Prevention Education during admission and/or prior to discharge.  Physician notified.  Carloyn Jaeger Grossman-Orr 09/07/2023, 11:54 AM

## 2023-09-07 NOTE — Progress Notes (Signed)
   09/07/23 1610  15 Minute Checks  Location [facility] Station  Visual Appearance Calm  Behavior Composed  Sleep (Behavioral Health Patients Only)  Calculate sleep? (Click Yes once per 24 hr at 0600 safety check) Yes  Documented sleep last 24 hours 3.25
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
## 2023-09-07 NOTE — B[facility] Group Notes (Signed)
[facility] Group Notes:  (Nursing/MHT/Case Management/Adjunct)  Date:  09/07/2023  Time:  10:01 AM  Type of Therapy:   Goals  Participation Level:  Active  Participation Quality:  Appropriate  Affect:  Appropriate  Cognitive:  Appropriate  Insight:  Appropriate  Engagement in Group:  Engaged  Modes of Intervention:  Discussion and Orientation  Summary of Progress/Problems: Goal is to be more open and honest  Azalee Course 09/07/2023, 10:01 AM

## 2023-09-07 NOTE — B[facility] Group Notes (Signed)
[facility] Group Notes:  (Nursing/MHT/Case Management/Adjunct)  Date:  09/07/2023  Time:  2000  Type of Therapy:   Wrap up group  Participation Level:  Active  Participation Quality:  Appropriate, Attentive, Sharing, and Supportive  Affect:  Appropriate  Cognitive:  Alert  Insight:  Improving  Engagement in Group:  Engaged  Modes of Intervention:  Clarification, Education, and Socialization  Summary of Progress/Problems: Positive thinking and self-care were discussed.   Marcille Buffy 09/07/2023, 9:13 PM

## 2023-09-07 NOTE — H&P (Cosign Needed Addendum)
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   Signed: Peterson Ao, MD 09/07/2023, 2:10 PM

## 2023-09-07 NOTE — [facility] Group Notes (Signed)
Type of Therapy and Topic:  Group Therapy: Mindful vs Mind Full  Participation Level: Minimal   Description of Group:   In this group, patients shared and discussed the importance of acknowledging the elements in their lives for when they are mindful and mind full and how this can positively impact their mood.  The group discussed how being mindful of their surroundings can benefit the decisions they make and how it can affect others. The group also discussed how being mind full can impact the way decisions are ineffective. An exercise was done as a group in which a list was made of mindfulness items in order to encourage participants to consider other potential positives in their lives.  Therapeutic Goals: Patients will identify one or more item for which they are mindfulness living through their day and who it can affect:  people, experiences, things, places, skills, and other. Patients will discuss how it is possible to apply a mindful mind to an event in their life. Patients will explore other possible items of a mindful vs mind full that they could remember.    Summary of Patient Progress:  Patient came in the last 10 mins of group. The patient shared that he use mindful when someone does things for him, to appreciate them like telling them thank you.  Patient's reaction to the group was productive for the last 10 mins, he was willing to the ice breaker.  Therapeutic Modalities:   Solution-Focused Therapy

## 2023-09-07 NOTE — Plan of Care (Signed)

## 2023-09-07 NOTE — [facility] Group Notes (Signed)
[facility] Group Notes:  (Nursing)  Date:  09/07/2023  Time:  1400  Type of Therapy:  Psychoeducational Skills  Participation Level:  Active  Participation Quality:  Appropriate and Attentive  Affect:  Appropriate  Cognitive:  Alert and Appropriate  Insight:  Appropriate and Good  Engagement in Group:  Engaged  Modes of Intervention:  Activity, Discussion, Exploration, Rapport Building, Socialization, and Support  Summary of Progress/Problems: Group practiced mindfulness/ made intuitive collage  Shela Nevin 09/07/2023, 4:39 PM

## 2023-09-07 NOTE — Plan of Care (Signed)
Nurse discussed anxiety, depression and coping skills with patient.  

## 2023-09-07 NOTE — Progress Notes (Signed)
D:  Patient denied SI and HI, contracts for safety.  Denied A/V hallucinations.  Patient stated he slept good last night, good quality sleep.  Knuckle pain has improved, feels better now.   A:  Medications administered per MD orders.  Emotional support and encouragement given patient. R:  Safety maintained with 15 minute checks.

## 2023-09-07 NOTE — [facility] Group Notes (Signed)
[facility] Group Notes:  (Nursing/MHT/Case Management/Adjunct)  Date:  09/07/2023  Time:  4:15 PM  Type of Therapy:  Music Therapy  Participation Level:  Active  Participation Quality:  Appropriate  Affect:  Appropriate  Cognitive:  Appropriate  Insight:  Appropriate  Engagement in Group:  Engaged  Modes of Intervention:  Activity  Summary of Progress/Problems:  Brian Frank 09/07/2023, 4:15 PM

## 2023-09-08 ENCOUNTER — Encounter (HOSPITAL_COMMUNITY): Payer: Self-pay

## 2023-09-08 DIAGNOSIS — F332 Major depressive disorder, recurrent severe without psychotic features: Secondary | ICD-10-CM | POA: Diagnosis not present

## 2023-09-08 MED ORDER — SERTRALINE HCL 100 MG PO TABS
100.0000 mg | ORAL_TABLET | Freq: Every day | ORAL | Status: DC
  Administered 2023-09-09 – 2023-09-11 (×3): 100 mg via ORAL
  Filled 2023-09-08 (×5): qty 1

## 2023-09-08 NOTE — Plan of Care (Signed)
  Problem: Education: Goal: Emotional status will improve Outcome: Progressing Goal: Mental status will improve Outcome: Progressing Goal: Verbalization of understanding the information provided will improve Outcome: Progressing   Problem: Activity: Goal: Interest or engagement in activities will improve Outcome: Progressing Goal: Sleeping patterns will improve Outcome: Progressing

## 2023-09-08 NOTE — Plan of Care (Signed)
  Problem: Education: Goal: Emotional status will improve Outcome: Progressing   Problem: Education: Goal: Mental status will improve Outcome: Progressing   Problem: Activity: Goal: Interest or engagement in activities will improve Outcome: Progressing Goal: Sleeping patterns will improve Outcome: Progressing   Problem: Coping: Goal: Ability to verbalize frustrations and anger appropriately will improve Outcome: Progressing Goal: Ability to demonstrate self-control will improve Outcome: Progressing   Problem: Safety: Goal: Periods of time without injury will increase Outcome: Progressing

## 2023-09-08 NOTE — Group Note (Signed)
Recreation Therapy Group Note   Group Topic:Stress Management  Group Date: 09/08/2023 Start Time: 0931 End Time: 0951 Facilitators: Kista Robb-McCall, LRT,CTRS Location: [address] Dayroom   Group Topic: Stress Management  Goal Area(s) Addresses:  Patient will identify positive stress management techniques. Patient will identify benefits of using stress management post d/c.  Group Description: Meditation. LRT engaged with patients about the benefits of meditation. LRT then played a meditation for patients that focused on self renewal and building confidence in ones self.    Education:  Stress Management, Discharge Planning.   Education Outcome: Acknowledges Education   Affect/Mood: Appropriate   Participation Level: Active   Participation Quality: Independent   Behavior: Appropriate   Speech/Thought Process: Focused   Insight: Good   Judgement: Good   Modes of Intervention: Meditation   Patient Response to Interventions:  Engaged   Education Outcome:  In group clarification offered    Clinical Observations/Individualized Feedback: Pt attended and participated in group session.     Plan: Continue to engage patient in RT group sessions 2-3x/week.   Lien Lyman-McCall, LRT,CTRS  09/08/2023 12:52 PM
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
## 2023-09-08 NOTE — [facility] Group Notes (Signed)
Spiritual care group on grief and loss facilitated by Chaplain Dyanne Carrel, [facility]  Group Goal: Support / Education around grief and loss  Members engage in facilitated group support and psycho-social education.  Group Description:  Following introductions and group rules, group members engaged in facilitated group dialogue and support around topic of loss, with particular support around experiences of loss in their lives. Group Identified types of loss (relationships / self / things) and identified patterns, circumstances, and changes that precipitate losses. Reflected on thoughts / feelings around loss, normalized grief responses, and recognized variety in grief experience. Group encouraged individual reflection on safe space and on the coping skills that they are already utilizing.  Group drew on Adlerian / Rogerian and narrative framework  Patient Progress: Brian Frank attended group and actively engaged and participated in group conversation and activities.  Comments demonstrated good insight and contributed positively to the group conversation.

## 2023-09-08 NOTE — Group Note (Signed)
Occupational Therapy Group Note  Group Topic: Sleep Hygiene  Group Date: 09/08/2023 Start Time: 1430 End Time: 1500 Facilitators: Ted Mcalpine, OT   Group Description: Group encouraged increased participation and engagement through topic focused on sleep hygiene. Patients reflected on the quality of sleep they typically receive and identified areas that need improvement. Group was given background information on sleep and sleep hygiene, including common sleep disorders. Group members also received information on how to improve one's sleep and introduced a sleep diary as a tool that can be utilized to track sleep quality over a length of time. Group session ended with patients identifying one or more strategies they could utilize or implement into their sleep routine in order to improve overall sleep quality.        Therapeutic Goal(s):  Identify one or more strategies to improve overall sleep hygiene  Identify one or more areas of sleep that are negatively impacted (sleep too much, too little, etc)     Participation Level: Engaged   Participation Quality: Independent   Behavior: Appropriate   Speech/Thought Process: Relevant   Affect/Mood: Flat   Insight: Fair   Judgement: Fair      Modes of Intervention: Education  Patient Response to Interventions:  Attentive   Plan: Continue to engage patient in OT groups 2 - 3x/week.  09/08/2023  Ted Mcalpine, OT   Kerrin Champagne, OT

## 2023-09-08 NOTE — Plan of Care (Signed)
  Problem: Education: Goal: Knowledge of [facility] Education information/materials will improve Outcome: Progressing Goal: Emotional status will improve Outcome: Progressing Goal: Mental status will improve Outcome: Progressing Goal: Verbalization of understanding the information provided will improve Outcome: Progressing   Problem: Activity: Goal: Interest or engagement in activities will improve Outcome: Progressing   

## 2023-09-08 NOTE — BH IP Treatment Plan (Signed)
Interdisciplinary Treatment and Diagnostic Plan Update  09/08/2023 Time of Session: 10:40 am Brian Frank MRN: [id number]  Principal Diagnosis: MDD (major depressive disorder), recurrent severe, without psychosis (HCC)  Secondary Diagnoses: Principal Problem:   MDD (major depressive disorder), recurrent severe, without psychosis (HCC) Active Problems:   Generalized anxiety disorder   Tobacco use disorder   Marijuana use   Current Medications:  Current Facility-Administered Medications  Medication Dose Route Frequency Provider Last Rate Last Admin   acetaminophen (TYLENOL) tablet 650 mg  650 mg Oral Q6H PRN White, Patrice L, NP       alum & mag hydroxide-simeth (MAALOX/MYLANTA) 200-200-20 MG/5ML suspension 30 mL  30 mL Oral Q4H PRN White, Patrice L, NP       haloperidol (HALDOL) tablet 5 mg  5 mg Oral TID PRN White, Patrice L, NP       And   diphenhydrAMINE (BENADRYL) capsule 50 mg  50 mg Oral TID PRN White, Patrice L, NP       haloperidol lactate (HALDOL) injection 5 mg  5 mg Intramuscular TID PRN White, Patrice L, NP       And   diphenhydrAMINE (BENADRYL) injection 50 mg  50 mg Intramuscular TID PRN White, Patrice L, NP       And   LORazepam (ATIVAN) injection 2 mg  2 mg Intramuscular TID PRN White, Patrice L, NP       haloperidol lactate (HALDOL) injection 10 mg  10 mg Intramuscular TID PRN White, Patrice L, NP       And   diphenhydrAMINE (BENADRYL) injection 50 mg  50 mg Intramuscular TID PRN White, Patrice L, NP       And   LORazepam (ATIVAN) injection 2 mg  2 mg Intramuscular TID PRN White, Patrice L, NP       hydrOXYzine (ATARAX) tablet 25 mg  25 mg Oral TID PRN White, Patrice L, NP       magnesium hydroxide (MILK OF MAGNESIA) suspension 30 mL  30 mL Oral Daily PRN White, Patrice L, NP       melatonin tablet 3 mg  3 mg Oral QHS Peterson Ao, MD   3 mg at 09/07/23 2112   nicotine polacrilex (NICORETTE) gum 2 mg  2 mg Oral Q4H while awake Dixon, Rashaun M, NP   2 mg at  09/08/23 1502   sertraline (ZOLOFT) tablet 50 mg  50 mg Oral Daily Peterson Ao, MD   50 mg at 09/08/23 0809   traZODone (DESYREL) tablet 50 mg  50 mg Oral QHS PRN White, Patrice L, NP   50 mg at 09/07/23 2112   PTA Medications: Medications Prior to Admission  Medication Sig Dispense Refill Last Dose/Taking   acetaminophen (TYLENOL) 500 MG tablet Take 500 mg by mouth every 6 (six) hours as needed for headache.   Past Month   ascorbic acid (VITAMIN C) 500 MG tablet Take 500 mg by mouth daily as needed (immune system booster).   Past Month    Patient Stressors:    Patient Strengths:    Treatment Modalities: Medication Management, Group therapy, Case management,  1 to 1 session with clinician, Psychoeducation, Recreational therapy.   Physician Treatment Plan for Primary Diagnosis: MDD (major depressive disorder), recurrent severe, without psychosis (HCC) Long Term Goal(s):     Short Term Goals:    Medication Management: Evaluate patient's response, side effects, and tolerance of medication regimen.  Therapeutic Interventions: 1 to 1 sessions, Unit Group sessions and Medication administration.  Evaluation of Outcomes: Not Progressing  Physician Treatment Plan for Secondary Diagnosis: Principal Problem:   MDD (major depressive disorder), recurrent severe, without psychosis (HCC) Active Problems:   Generalized anxiety disorder   Tobacco use disorder   Marijuana use  Long Term Goal(s):     Short Term Goals:       Medication Management: Evaluate patient's response, side effects, and tolerance of medication regimen.  Therapeutic Interventions: 1 to 1 sessions, Unit Group sessions and Medication administration.  Evaluation of Outcomes: Not Progressing   RN Treatment Plan for Primary Diagnosis: MDD (major depressive disorder), recurrent severe, without psychosis (HCC) Long Term Goal(s): Knowledge of disease and therapeutic regimen to maintain health will improve  Short Term  Goals: Ability to remain free from injury will improve, Ability to verbalize frustration and anger appropriately will improve, Ability to demonstrate self-control, Ability to participate in decision making will improve, Ability to verbalize feelings will improve, Ability to disclose and discuss suicidal ideas, Ability to identify and develop effective coping behaviors will improve, and Compliance with prescribed medications will improve  Medication Management: RN will administer medications as ordered by provider, will assess and evaluate patient's response and provide education to patient for prescribed medication. RN will report any adverse and/or side effects to prescribing provider.  Therapeutic Interventions: 1 on 1 counseling sessions, Psychoeducation, Medication administration, Evaluate responses to treatment, Monitor vital signs and CBGs as ordered, Perform/monitor CIWA, COWS, AIMS and Fall Risk screenings as ordered, Perform wound care treatments as ordered.  Evaluation of Outcomes: Not Progressing   LCSW Treatment Plan for Primary Diagnosis: MDD (major depressive disorder), recurrent severe, without psychosis (HCC) Long Term Goal(s): Safe transition to appropriate next level of care at discharge, Engage patient in therapeutic group addressing interpersonal concerns.  Short Term Goals: Engage patient in aftercare planning with referrals and resources, Increase social support, Increase ability to appropriately verbalize feelings, Increase emotional regulation, and Increase skills for wellness and recovery  Therapeutic Interventions: Assess for all discharge needs, 1 to 1 time with Social worker, Explore available resources and support systems, Assess for adequacy in community support network, Educate family and significant other(s) on suicide prevention, Complete Psychosocial Assessment, Interpersonal group therapy.  Evaluation of Outcomes: Not Progressing   Progress in  Treatment: Attending groups: Yes. Participating in groups: Yes. Taking medication as prescribed: Yes. Toleration medication: Yes. Family/Significant other contact made: No, will contact:  pt declined consent Patient understands diagnosis: Yes. Discussing patient identified problems/goals with staff: Yes. Medical problems stabilized or resolved: Yes. Denies suicidal/homicidal ideation: Yes. Issues/concerns per patient self-inventory: No. Other: none reported  New problem(s) identified: No, Describe:  none reported  New Short Term/Long Term Goal(s):  medication stabilization, elimination of SI thoughts, development of comprehensive mental wellness plan.    Patient Goals:  " having a more positive mindset and not having suicidal thoughts"  Discharge Plan or Barriers: Patient recently admitted. CSW will continue to follow and assess for appropriate referrals and possible discharge planning.    Reason for Continuation of Hospitalization: Anxiety Depression Medication stabilization Suicidal ideation  Estimated Length of Stay: 5-7 days  Last 3 [place] Suicide Severity Risk Score: Flowsheet Row [facility] (Current[facility] Most recent reading at 09/06/2023  4:40 PM ED from 09/06/2023 in [facility] Most recent reading at 09/06/2023  1:00 PM ED from 08/18/2023 in [facility] Most recent reading at 08/18/2023  6:06 PM  C-SSRS RISK  CATEGORY High Risk Error: Question 6 not populated No Risk       Last PHQ 2/9 Scores:     No data to display          Scribe for Treatment Team: Kathrynn Humble 09/08/2023 3:55 PM

## 2023-09-08 NOTE — Group Note (Signed)
Date:  09/08/2023 Time:  9:10 AM  Group Topic/Focus:  Goals Group:   The focus of this group is to help patients establish daily goals to achieve during treatment and discuss how the patient can incorporate goal setting into their daily lives to aide in recovery. Orientation:   The focus of this group is to educate the patient on the purpose and policies of crisis stabilization and provide a format to answer questions about their admission.  The group details unit policies and expectations of patients while admitted.    Participation Level:  Active  Participation Quality:  Appropriate  Affect:  Appropriate  Cognitive:  Appropriate  Insight: Appropriate  Engagement in Group:  Engaged  Modes of Intervention:  Discussion  Additional Comments:    Takai Chiaramonte D Manilla Strieter 09/08/2023, 9:10 AM

## 2023-09-08 NOTE — Group Note (Signed)
Date:  09/08/2023 Time:  11:38 AM  Group Topic/Focus:   Healthy Communication:   The focus of this group is to discuss communication, barriers to communication, as well as healthy ways to communicate with others (Boundaries).     Participation Level:  Active  Participation Quality:  Appropriate, Attentive, and Sharing  Affect:  Appropriate  Cognitive:  Appropriate  Insight: Appropriate  Engagement in Group:  Engaged  Modes of Intervention:  Discussion and Education  Additional Comments:    Arnoldo Hooker 09/08/2023, 11:38 AM

## 2023-09-08 NOTE — Progress Notes (Signed)
Patient rated his depression level 3/10 and his anxiety level 0/10 with 10 being the highest and 0 none. Medication and group compliant. Pt observed interacting well with peers. Pt denied SI/HI and AVH. Appetite good on shift. Safety maintained.  09/08/23 0855  Psych Admission Type (Psych Patients Only)  Admission Status Voluntary  Psychosocial Assessment  Patient Complaints Depression  Eye Contact Fair  Facial Expression Animated  Affect Anxious  Speech Logical/coherent  Interaction Assertive  Motor Activity Other (Comment) (WNL)  Appearance/Hygiene Unremarkable  Behavior Characteristics Cooperative  Mood Depressed;Anxious  Thought Process  Coherency WDL  Content WDL  Delusions None reported or observed  Perception WDL  Hallucination None reported or observed  Judgment Limited  Confusion None  Danger to Self  Current suicidal ideation? Denies  Agreement Not to Harm Self Yes  Description of Agreement Verbal  Danger to Others  Danger to Others None reported or observed

## 2023-09-08 NOTE — Progress Notes (Signed)
   09/08/23 2000  Psych Admission Type (Psych Patients Only)  Admission Status Voluntary  Psychosocial Assessment  Patient Complaints Depression  Eye Contact Brief  Facial Expression Flat  Affect Anxious  Speech Logical/coherent  Interaction Assertive  Motor Activity Other (Comment) (WNL)  Appearance/Hygiene Unremarkable  Behavior Characteristics Cooperative  Mood Depressed  Thought Process  Coherency WDL  Content WDL  Delusions None reported or observed  Perception WDL  Hallucination None reported or observed  Judgment Limited  Confusion None  Danger to Self  Current suicidal ideation? Denies  Agreement Not to Harm Self Yes  Description of Agreement Verbal Contract  Danger to Others  Danger to Others None reported or observed

## 2023-09-08 NOTE — [facility] Group Notes (Signed)
[facility] Group Notes:  (Nursing/MHT/Case Management/Adjunct)  Date:  09/08/2023  Time:  9:26 PM  Type of Therapy:   Wrap-up group  Participation Level:  Active  Participation Quality:  Appropriate  Affect:  Appropriate  Cognitive:  Appropriate  Insight:  Appropriate  Engagement in Group:  Engaged  Modes of Intervention:  Education  Summary of Progress/Problems: Goal to be more open. Met goal. Rated day 5/10.  Brian Frank 09/08/2023, 9:26 PM
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
## 2023-09-09 NOTE — Plan of Care (Signed)
  Problem: Education: Goal: Emotional status will improve Outcome: Progressing Goal: Mental status will improve Outcome: Progressing   Problem: Activity: Goal: Interest or engagement in activities will improve Outcome: Progressing Goal: Sleeping patterns will improve Outcome: Progressing
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
## 2023-09-09 NOTE — Group Note (Signed)
Recreation Therapy Group Note   Group Topic:Animal Assisted Therapy   Group Date: 09/09/2023 Start Time: 0950 End Time: 1030 Facilitators: Farley Crooker-McCall, LRT,CTRS Location: [address] Dayroom   Animal-Assisted Activity (AAA) Program Checklist/Progress Notes Patient Eligibility Criteria Checklist & Daily Group note for Rec Tx Intervention  AAA/T Program Assumption of Risk Form signed by Patient/ or Parent Legal Guardian Yes  Patient is free of allergies or severe asthma Yes  Patient reports no fear of animals Yes  Patient reports no history of cruelty to animals Yes  Patient understands his/her participation is voluntary Yes  Patient washes hands before animal contact Yes  Patient washes hands after animal contact Yes  Education: Hand Washing, Appropriate Animal Interaction   Education Outcome: Acknowledges education.    Affect/Mood: Appropriate   Participation Level: Engaged   Participation Quality: Independent   Behavior: Appropriate   Speech/Thought Process: Focused   Insight: Good   Judgement: Good   Modes of Intervention: Teaching laboratory technician   Patient Response to Interventions:  Engaged   Education Outcome:  In group clarification offered    Clinical Observations/Individualized Feedback: Patient attended session and interacted appropriately with therapy dog and peers. Patient asked appropriate questions about therapy dog and his training. Patient shared stories about their pets at home with group.      Plan: Continue to engage patient in RT group sessions 2-3x/week.   Dannya Pitkin-McCall, LRT,CTRS 09/09/2023 1:28 PM

## 2023-09-09 NOTE — [facility] Group Notes (Signed)
[facility] Group Notes:  (Nursing/MHT/Case Management/Adjunct)  Date:  09/09/2023  Time:  2000  Type of Therapy:   Wrap up group  Participation Level:  Active  Participation Quality:  Appropriate, Attentive, and Sharing  Affect:  Appropriate  Cognitive:  Alert and Appropriate  Insight:  Appropriate  Engagement in Group:  Engaged and Supportive  Modes of Intervention:  Discussion and Support  Summary of Progress/Problems: Pt. Rate day a 9. Pt stated that overall hehad a good day. Stated at the start of the day he had a low period.  Pt stated after talking with other patients and making a connection he felt better. Pt. also state he didn't have a goal set for today but feel as he achieved some goal.  Fay Records 09/09/2023, 8:56 PM

## 2023-09-09 NOTE — Progress Notes (Signed)
The patient is sitting on the floor just inside the doorway to his room and is reading.

## 2023-09-09 NOTE — Progress Notes (Signed)
   09/09/23 2300  Psych Admission Type (Psych Patients Only)  Admission Status Voluntary  Psychosocial Assessment  Patient Complaints Anxiety  Eye Contact Brief  Facial Expression Other (Comment) (WNL)  Affect Anxious  Speech Logical/coherent  Interaction Assertive  Motor Activity Other (Comment) (WNL)  Appearance/Hygiene Unremarkable  Behavior Characteristics Cooperative  Mood Anxious  Thought Process  Coherency WDL  Content WDL  Delusions None reported or observed  Perception WDL  Hallucination None reported or observed  Judgment Limited  Confusion None  Danger to Self  Current suicidal ideation? Denies  Agreement Not to Harm Self Yes  Description of Agreement Verbal Contract  Danger to Others  Danger to Others None reported or observed

## 2023-09-09 NOTE — Progress Notes (Addendum)
Pt denied SI/HI/AVH this morning. Pt rated his depression a 8/10, anxiety a 7/10. Pt has been pleasant, calm, and cooperative throughout the shift. Pt given scheduled medications as prescribed. Q15 min checks verified for safety. Patient verbally contracts for safety. Patient compliant with medications and treatment plan. Patient is interacting well on the unit. Pt is safe on the unit.   09/09/23 0900  Psych Admission Type (Psych Patients Only)  Admission Status Voluntary  Psychosocial Assessment  Patient Complaints Depression;Anxiety  Eye Contact Fair  Facial Expression Flat;Sad  Affect Anxious;Depressed  Speech Logical/coherent  Interaction Assertive  Motor Activity Other (Comment) (WDL)  Appearance/Hygiene Unremarkable  Behavior Characteristics Cooperative;Anxious  Mood Depressed;Anxious  Thought Process  Coherency WDL  Content WDL  Delusions None reported or observed  Perception WDL  Hallucination None reported or observed  Judgment Impaired  Confusion None  Danger to Self  Current suicidal ideation? Denies  Description of Suicide Plan No plan  Agreement Not to Harm Self Yes  Description of Agreement verbal  Danger to Others  Danger to Others None reported or observed

## 2023-09-09 NOTE — Plan of Care (Signed)
  Problem: Education: Goal: Emotional status will improve Outcome: Progressing Goal: Mental status will improve Outcome: Progressing Goal: Verbalization of understanding the information provided will improve Outcome: Progressing   Problem: Activity: Goal: Interest or engagement in activities will improve Outcome: Progressing   Problem: Activity: Goal: Sleeping patterns will improve Outcome: Not Progressing

## 2023-09-09 NOTE — Group Note (Signed)
LCSW Group Therapy Note  Group Date: 09/09/2023 Start Time: 1100 End Time: 1200   Type of Therapy and Topic:  Group Therapy - Healthy vs Unhealthy Coping Skills  Participation Level:  Active   Description of Group The focus of this group was to determine what unhealthy coping techniques typically are used by group members and what healthy coping techniques would be helpful in coping with various problems. Patients were guided in becoming aware of the differences between healthy and unhealthy coping techniques. Patients were asked to identify 2-3 healthy coping skills they would like to learn to use more effectively.  Therapeutic Goals Patients learned that coping is what human beings do all day long to deal with various situations in their lives Patients defined and discussed healthy vs unhealthy coping techniques Patients identified their preferred coping techniques and identified whether these were healthy or unhealthy Patients determined 2-3 healthy coping skills they would like to become more familiar with and use more often. Patients provided support and ideas to each other   Summary of Patient Progress:  During group, Brian [place] expressed feedback on the topic. Patient proved open to input from peers and feedback from CSW. Patient demonstrated good insight into the subject matter, was respectful of peers, and participated throughout the entire session.   Therapeutic Modalities Cognitive Behavioral Therapy Motivational Interviewing  [place], [place] 09/09/2023  1:26 PM

## 2023-09-10 DIAGNOSIS — F332 Major depressive disorder, recurrent severe without psychotic features: Secondary | ICD-10-CM | POA: Diagnosis not present

## 2023-09-10 MED ORDER — MELATONIN 5 MG PO TABS
5.0000 mg | ORAL_TABLET | Freq: Every day | ORAL | Status: DC
  Administered 2023-09-10: 5 mg via ORAL
  Filled 2023-09-10 (×3): qty 1

## 2023-09-10 NOTE — Group Note (Signed)
Recreation Therapy Group Note   Group Topic:Other  Group Date: 09/10/2023 Start Time: 1400 End Time: 1435 Facilitators: Symon Norwood-McCall, LRT,CTRS Location: [address] Dayroom   Activity Description/Intervention: Therapeutic Drumming. Patients with peers and staff were given the opportunity to engage in a leader facilitated [facility] Group Empowerment Drumming Circle with staff from the [employer], in partnership with [employer]. Teaching laboratory technician and trained [employer], Theodoro Doing leading with LRT observing and documenting intervention and pt response. This evidenced-based practice targets 7 areas of health and wellbeing in the human experience including: stress-reduction, exercise, self-expression, camaraderie/support, nurturing, spirituality, and music-making (leisure).   Goal Area(s) Addresses:  Patient will engage in pro-social way in music group.  Patient will follow directions of drum leader on the first prompt. Patient will demonstrate no behavioral issues during group.  Patient will identify if a reduction in stress level occurs as a result of participation in therapeutic drum circle.    Education: Leisure exposure, Pharmacologist, Musical expression, Discharge Planning   Affect/Mood: Appropriate   Participation Level: Engaged   Participation Quality: Independent   Behavior: Appropriate   Speech/Thought Process: Focused   Insight: Good   Judgement: Good   Modes of Intervention: Teaching laboratory technician   Patient Response to Interventions:  Engaged   Education Outcome:  Acknowledges education   Clinical Observations/Individualized Feedback: [place]on actively engaged in therapeutic drumming exercise and discussions. Pt was appropriate with peers, staff, and musical equipment for duration of programming.  Pt identified "purpose" as their feeling after participation in music-based programming.    Plan: Continue to engage patient in RT  group sessions 2-3x/week.   Itzae Miralles-McCall, LRT,CTRS  09/10/2023 3:31 PM

## 2023-09-10 NOTE — [facility] Group Notes (Signed)
[facility] Group Notes:  (Nursing/MHT/Case Management/Adjunct)  Date:  09/10/2023  Time:  2000  Type of Therapy:   Wrap up group  Participation Level:  Active  Participation Quality:  Appropriate, Attentive, Sharing, and Supportive  Affect:  Appropriate  Cognitive:  Alert  Insight:  Improving  Engagement in Group:  Engaged  Modes of Intervention:  Clarification, Education, and Support  Summary of Progress/Problems: Positive thinking and positive change were discussed.   Brian Frank S 09/10/2023, 9:00 PM

## 2023-09-10 NOTE — Plan of Care (Signed)
  Problem: Education: Goal: Emotional status will improve Outcome: Progressing Goal: Mental status will improve Outcome: Progressing   Problem: Activity: Goal: Interest or engagement in activities will improve Outcome: Progressing Goal: Sleeping patterns will improve Outcome: Progressing

## 2023-09-10 NOTE — [facility] Group Notes (Signed)

## 2023-09-10 NOTE — Group Note (Signed)
Date:  09/10/2023 Time:  10:22 AM  Group Topic/Focus:  Developing a Wellness Toolbox:   The focus of this group is to help patients develop a "wellness toolbox" with skills and strategies to promote recovery upon discharge. Goals Group:   The focus of this group is to help patients establish daily goals to achieve during treatment and discuss how the patient can incorporate goal setting into their daily lives to aide in recovery.    Participation Level:  Active  Participation Quality:  Appropriate  Affect:  Appropriate  Cognitive:  Appropriate  Insight: Appropriate  Engagement in Group:  Improving  Modes of Intervention:  Education  Additional Comments:  Pt attended Group  Brian Frank E Finneas Mathe 09/10/2023, 10:22 AM
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
## 2023-09-10 NOTE — Progress Notes (Signed)
   09/10/23 2200  Psych Admission Type (Psych Patients Only)  Admission Status Voluntary  Psychosocial Assessment  Patient Complaints None  Eye Contact Brief  Facial Expression Other (Comment) (WNL)  Affect Anxious  Speech Logical/coherent  Interaction Assertive  Motor Activity Other (Comment) (WNL)  Appearance/Hygiene Unremarkable  Behavior Characteristics Cooperative  Mood Pleasant  Thought Process  Coherency WDL  Content WDL  Delusions None reported or observed  Perception WDL  Hallucination None reported or observed  Judgment Limited  Confusion None  Danger to Self  Current suicidal ideation? Denies  Description of Suicide Plan No Plan  Agreement Not to Harm Self Yes  Description of Agreement Verbal Contract  Danger to Others  Danger to Others None reported or observed

## 2023-09-10 NOTE — Progress Notes (Addendum)
Pt denied SI/HI/AVH this morning. Pt denied having depression or anxiety this morning. Pt reports that he slept "better" after taking the Trazodone last night. Pt presents with a pleasant mood and has been interactive on the milieu throughout the day. Pt given scheduled medications as prescribed. Q15 min checks verified for safety. Patient verbally contracts for safety. Patient compliant with medications and treatment plan. Patient is interacting well on the unit. Pt is safe on the unit.   09/10/23 0800  Psych Admission Type (Psych Patients Only)  Admission Status Voluntary  Psychosocial Assessment  Patient Complaints None  Eye Contact Fair  Facial Expression Animated  Affect Other (Comment) (WDL)  Speech Logical/coherent  Interaction Assertive  Motor Activity Other (Comment) (WDL)  Appearance/Hygiene Unremarkable  Behavior Characteristics Appropriate to situation;Cooperative  Mood Pleasant  Thought Process  Coherency WDL  Content WDL  Delusions None reported or observed  Perception WDL  Hallucination None reported or observed  Judgment Impaired  Confusion None  Danger to Self  Current suicidal ideation? Denies  Description of Suicide Plan No plan  Agreement Not to Harm Self Yes  Description of Agreement Verbal  Danger to Others  Danger to Others None reported or observed

## 2023-09-10 NOTE — Plan of Care (Signed)
  Problem: Education: Goal: Mental status will improve Outcome: Progressing Goal: Verbalization of understanding the information provided will improve Outcome: Progressing   Problem: Activity: Goal: Interest or engagement in activities will improve Outcome: Progressing Goal: Sleeping patterns will improve Outcome: Progressing   Problem: Coping: Goal: Ability to verbalize frustrations and anger appropriately will improve Outcome: Progressing

## 2023-09-11 DIAGNOSIS — F332 Major depressive disorder, recurrent severe without psychotic features: Principal | ICD-10-CM

## 2023-09-11 MED ORDER — HYDROXYZINE HCL 25 MG PO TABS: 25.0000 mg | ORAL_TABLET | Freq: Three times a day (TID) | ORAL | 0 refills | Status: DC | PRN

## 2023-09-11 MED ORDER — MELATONIN 5 MG PO TABS: 5.0000 mg | ORAL_TABLET | Freq: Every day | ORAL | Status: DC

## 2023-09-11 MED ORDER — TRAZODONE HCL 50 MG PO TABS: 50.0000 mg | ORAL_TABLET | Freq: Every evening | ORAL | 0 refills | Status: DC | PRN

## 2023-09-11 MED ORDER — NICOTINE POLACRILEX 2 MG MT GUM: 2.0000 mg | CHEWING_GUM | OROMUCOSAL | 0 refills | Status: DC

## 2023-09-11 MED ORDER — SERTRALINE HCL 100 MG PO TABS: 100.0000 mg | ORAL_TABLET | Freq: Every day | ORAL | 0 refills | Status: DC
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
## 2023-09-11 NOTE — [facility] Suicide Risk Assessment (Signed)
[facility] INPATIENT:  Family/Significant Other Suicide Prevention Education  Suicide Prevention Education:  Education Completed; Marin Shutter [phone]  (name of family member/significant other) has been identified by the patient as the family member/significant other with whom the patient will be residing, and identified as the person(s) who will aid the patient in the event of a mental health crisis (suicidal ideations/suicide attempt).  With written consent from the patient, the family member/significant other has been provided the following suicide prevention education, prior to the and/or following the discharge of the patient.  CSW received permission to speak with pt's sister who reported no safety concerns with pt returning to her home. [name] reported she has no firearms in the home and has secured all sharps and medications.   The suicide prevention education provided includes the following: Suicide risk factors Suicide prevention and interventions National Suicide Hotline telephone number [facility] assessment telephone number [facility] Emergency Assistance 911 [facility] and/or Residential Mobile Crisis Unit telephone number  Request made of family/significant other to: Remove weapons (e.g., guns, rifles, knives), all items previously/currently identified as safety concern.   Remove drugs/medications (over-the-counter, prescriptions, illicit drugs), all items previously/currently identified as a safety concern.  The family member/significant other verbalizes understanding of the suicide prevention education information provided.  The family member/significant other agrees to remove the items of safety concern listed above.  Rogene Houston 09/11/2023, 10:53 AM

## 2023-09-11 NOTE — Progress Notes (Signed)
Patient is discharging at this time. Patient is A&Ox4. Stable. Patient denies SI,HI, and A/V/H with no plan/intent. Printed AVS reviewed with and given to patient along with prescriptions and follow up appointments. Suicide safety plan complete with copy provided to patient. Original form in chart. Patient survey complete.Patient verbalized all understanding. All valuables/belongings returned to patient. Patient is being transported by his sister . Patient denies any pain/discomfort. No s/s of current distress.

## 2023-09-11 NOTE — Group Note (Signed)
Date:  09/11/2023 Time:  11:05 AM  Group Topic/Focus:  Goals Group:   The focus of this group is to help patients establish daily goals to achieve during treatment and discuss how the patient can incorporate goal setting into their daily lives to aide in recovery. Orientation:   The focus of this group is to educate the patient on the purpose and policies of crisis stabilization and provide a format to answer questions about their admission.  The group details unit policies and expectations of patients while admitted.    Participation Level:  Active  Participation Quality:  Appropriate  Affect:  Appropriate  Cognitive:  Appropriate  Insight: Appropriate  Engagement in Group:  Engaged  Modes of Intervention:  Discussion  Additional Comments:    Mattie Nordell D Jaedynn Bohlken 09/11/2023, 11:05 AM

## 2023-09-11 NOTE — Discharge Instructions (Addendum)
-  Follow-up with your outpatient psychiatric provider -instructions on appointment date, time, and address (location) are provided to you in discharge paperwork.  -Take your psychiatric medications as prescribed at discharge - instructions are provided to you in the discharge paperwork As we discussed, we will provide printed prescriptions at discharge - take these to the pharmacy of your choosing to have your medications filled.   -Follow-up with outpatient primary care doctor and other specialists -for management of preventative medicine and any chronic medical disease.  -Recommend abstinence from alcohol, tobacco, and other illicit drug use at discharge.   -If your psychiatric symptoms recur, worsen, or if you have side effects to your psychiatric medications, call your outpatient psychiatric provider, 911, 988 or go to the nearest emergency department.  -If suicidal thoughts occur, call your outpatient psychiatric provider, 911, 988 or go to the nearest emergency department.  Naloxone (Narcan) can help reverse an overdose when given to the victim quickly.  [facility] offers free naloxone kits and instructions/training on its use.  Add naloxone to your first aid kit and you can help save a life.   Pick up your free kit at the following locations:   [place]:  [facility], [address] [place] [address] [phone]) [facility] [address] [place] [address] [phone]) [facility] [address] [place] [address]  [place]: [facility] [address] [place] [address] ([phone]) [facility] [address] [place] [address] [phone])
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
## 2023-10-06 ENCOUNTER — Encounter (HOSPITAL_COMMUNITY): Payer: Self-pay | Admitting: Psychiatry

## 2023-10-06 ENCOUNTER — Ambulatory Visit (HOSPITAL_COMMUNITY): Payer: PRIVATE HEALTH INSURANCE | Admitting: Psychiatry

## 2023-10-06 DIAGNOSIS — F17210 Nicotine dependence, cigarettes, uncomplicated: Secondary | ICD-10-CM

## 2023-10-06 DIAGNOSIS — F332 Major depressive disorder, recurrent severe without psychotic features: Secondary | ICD-10-CM | POA: Diagnosis not present

## 2023-10-06 DIAGNOSIS — F411 Generalized anxiety disorder: Secondary | ICD-10-CM

## 2023-10-06 DIAGNOSIS — F17200 Nicotine dependence, unspecified, uncomplicated: Secondary | ICD-10-CM

## 2023-10-06 MED ORDER — SERTRALINE HCL 100 MG PO TABS: 100.0000 mg | ORAL_TABLET | Freq: Every day | ORAL | 3 refills | Status: DC

## 2023-10-06 MED ORDER — MIRTAZAPINE 15 MG PO TABS: 15.0000 mg | ORAL_TABLET | Freq: Every day | ORAL | 3 refills | Status: DC

## 2023-10-06 MED ORDER — HYDROXYZINE HCL 25 MG PO TABS: 25.0000 mg | ORAL_TABLET | Freq: Three times a day (TID) | ORAL | 3 refills | Status: DC

## 2023-10-06 MED ORDER — NICOTINE 21 MG/24HR TD PT24: 21.0000 mg | MEDICATED_PATCH | Freq: Every day | TRANSDERMAL | 3 refills | Status: DC

## 2023-10-06 MED ORDER — MELATONIN 5 MG PO TABS: 5.0000 mg | ORAL_TABLET | Freq: Every day | ORAL | 3 refills | Status: DC

## 2023-10-06 NOTE — Progress Notes (Signed)
 Psychiatric Initial Adult Assessment  Virtual Visit via Video Note  I connected with Brian Frank on 10/06/23 at  1:00 PM EST by a video enabled telemedicine application and verified that I am speaking with the correct person using two identifiers.  Location: Patient: Home Provider: Clinic   I discussed the limitations of evaluation and management by telemedicine and the availability of in person appointments. The patient expressed understanding and agreed to proceed.  I provided 45 minutes of non-face-to-face time during this encounter.   Patient Identification: Brian Frank MRN:  [id number] Date of Evaluation:  10/06/2023 Referral Source: [facility] Chief Complaint:  I am doing somewhat better Visit Diagnosis:    ICD-10-CM   1. Tobacco use disorder  F17.200 nicotine  (NICODERM CQ ) 21 mg/24hr patch    2. Generalized anxiety disorder  F41.1 hydrOXYzine  (ATARAX ) 25 MG tablet    sertraline  (ZOLOFT ) 100 MG tablet    melatonin 5 MG TABS    mirtazapine  (REMERON ) 15 MG tablet    3. MDD (major depressive disorder), recurrent severe, without psychosis (HCC)  F33.2 sertraline  (ZOLOFT ) 100 MG tablet    melatonin 5 MG TABS    mirtazapine  (REMERON ) 15 MG tablet      History of Present Illness:  28 year old male seen today for initial psychiatric evaluation.  He was referred to outpatient psychiatry by [facility] where he presented on 09/06/2023 through 09/11/2023 for increasing depression and chronic SI with a plan to cut himself with a knife.  He has a psychiatric history of depression, anxiety, tobacco use, marijuana use, and self-reported ADHD.  He is currently prescribed trazodone  50 mg nightly as needed, Zoloft  100 mg daily, hydroxyzine  25 mg 3 times a day as needed, melatonin 5 mg nightly, and Nicorette  2 mg gum.  Patient reports that his medication is somewhat effective in managing his psychiatric condition.  Today he is well-groomed, pleasant, cooperative, and engaged in conversation.  He informed  clinical research associate that he feels somewhat better since his hospitalization.  He does note that he continues to struggle with increased anxiety, depression, and poor sleep.  Patient notes that he is anxious about his living situation and his finances.  Patient currently resides with his step mother and his biological mother.  He notes that he has stepmother does not get along and he tries to isolate himself to prevent conflict.  He informed clinical research associate that he was living with a roommate however they lost their apartment as his roommate got sick and was unable to help financially.  Patient notes that currently he is unemployed but is awaiting an upcoming interview.  Patient reports that the above exacerbates his anxiety and depression.  Today provider conducted a GAD-7 and patient scored an 18.  Provider also conducted PHQ-9 patient scored 20.  Patient informed writer that his sleep has been poor as he has vivid nightmares while on trazodone .  He reports that his stress also impacts his sleep.  He notes that he sleeps 3 to 4 hours nightly.  He also reports that his appetite is poor and he has lost weight.  Today he denies SI/HI, mania, or paranoia.  Patient denies [facility] and notes that he hears his own negative self thoughts.  Patient informed writer that he vapes tobacco daily but notes that he discontinued marijuana after his hospitalization.  He denies alcohol or other illegal drug use.  Patient informed writer that he has chronic pain in his wrist.  Notes that he fractured it over 10 years ago and notes  that movement exacerbates his pain.  He notes that his pain can elevate from 0-10 with increased movement.  At this time trazodone  discontinued due to increasing nightmares.  Today he is agreeable to starting mirtazapine  15 mg to help manage sleep, anxiety, and depression.  Patient also notes that the Nicorette  gum caused him to have a sore throat and at this time he would like to discontinue it.  He will start Nicorette  CQ 21  mg patches to help manage tobacco dependence.Potential side effects of medication and risks vs benefits of treatment vs non-treatment were explained and discussed. All questions were answered.  Continue all other medications as prescribed.  No other concerns at this time.   Associated Signs/Symptoms: Depression Symptoms:  depressed mood, anhedonia, insomnia, psychomotor agitation, fatigue, feelings of worthlessness/guilt, difficulty concentrating, anxiety, panic attacks, loss of energy/fatigue, disturbed sleep, decreased appetite, (Hypo) Manic Symptoms:  Distractibility, Flight of Ideas, Irritable Mood, Anxiety Symptoms:  Excessive Worry, Panic Symptoms, Psychotic Symptoms:   Denies PTSD Symptoms: NA  Past Psychiatric History: Depression, Anxiety, Insomnia, Tobacco use,  self-reported ADHD, and Marijuana use  Previous Psychotropic Medications:  Zoloft , trazodone  (caused vivid nightmares), melatonin, hydroxyzine , Nicorette  gum, mirtazapine   Substance Abuse History in the last 12 months:  Yes.    Consequences of Substance Abuse: NA  Past Medical History:  Past Medical History:  Diagnosis Date   Renal disorder    No past surgical history on file.  Family Psychiatric History: Mother DID, Sister PTSD, and father undiagnosed mental health issues  Family History: No family history on file.  Social History:   Social History   Socioeconomic History   Marital status: Single    Spouse name: Not on file   Number of children: Not on file   Years of education: Not on file   Highest education level: Not on file  Occupational History   Not on file  Tobacco Use   Smoking status: Former    Types: Cigarettes   Smokeless tobacco: Never  Vaping Use   Vaping status: Every Day   Substances: Nicotine   Substance and Sexual Activity   Alcohol use: Yes   Drug use: Yes    Types: Marijuana   Sexual activity: Not on file  Other Topics Concern   Not on file  Social History  Narrative   Not on file   Social Drivers of Health   Financial Resource Strain: Not on file  Food Insecurity: Food Insecurity Present (09/06/2023)   Hunger Vital Sign    Worried About Running Out of Food in the Last Year: Often true    Ran Out of Food in the Last Year: Sometimes true  Transportation Needs: No Transportation Needs (09/06/2023)   PRAPARE - Administrator, Civil Service (Medical): No    Lack of Transportation (Non-Medical): No  Physical Activity: Not on file  Stress: Not on file  Social Connections: Not on file    Additional Social History: Patient resides in [place] with with his mother and stepmother. He has been dating for two months. He is currently unemployed and has no children. He vapes nicotine  daily. He denies alcohol or illegal drug use (he notes that he has not used marijuana since his hospitalization) .  Allergies:   Allergies  Allergen Reactions   Strawberry Extract Anaphylaxis    Strawberry seeds is the main problem    Metabolic Disorder Labs: Lab Results  Component Value Date   HGBA1C 5.1 09/06/2023   MPG 99.67 09/06/2023  No results found for: PROLACTIN Lab Results  Component Value Date   CHOL 166 09/06/2023   TRIG 50 09/06/2023   HDL 49 09/06/2023   CHOLHDL 3.4 09/06/2023   VLDL 10 09/06/2023   LDLCALC 107 (H) 09/06/2023   Lab Results  Component Value Date   TSH 0.743 09/06/2023    Therapeutic Level Labs: No results found for: LITHIUM No results found for: CBMZ No results found for: VALPROATE  Current Medications: Current Outpatient Medications  Medication Sig Dispense Refill   mirtazapine  (REMERON ) 15 MG tablet Take 1 tablet (15 mg total) by mouth at bedtime. 30 tablet 3   nicotine  (NICODERM CQ ) 21 mg/24hr patch Place 1 patch (21 mg total) onto the skin daily. 28 patch 3   ascorbic acid (VITAMIN C) 500 MG tablet Take 500 mg by mouth daily as needed (immune system booster).     hydrOXYzine  (ATARAX ) 25 MG  tablet Take 1 tablet (25 mg total) by mouth 3 (three) times daily. 90 tablet 3   melatonin 5 MG TABS Take 1 tablet (5 mg total) by mouth at bedtime. 30 tablet 3   sertraline  (ZOLOFT ) 100 MG tablet Take 1 tablet (100 mg total) by mouth daily. 30 tablet 3   No current facility-administered medications for this visit.    Musculoskeletal: Strength & Muscle Tone: within normal limits and telehealth visit Gait & Station: normal, telehealth visit Patient leans: N/A  Psychiatric Specialty Exam: Review of Systems  There were no vitals taken for this visit.There is no height or weight on file to calculate BMI.  General Appearance: Well Groomed  Eye Contact:  Good  Speech:  Clear and Coherent and Normal Rate  Volume:  Normal  Mood:  Anxious and Depressed  Affect:  Appropriate and Congruent  Thought Process:  Coherent, Goal Directed, and Linear  Orientation:  Full (Time, Place, and Person)  Thought Content:  WDL and Logical  Suicidal Thoughts:  No  Homicidal Thoughts:  No  Memory:  Immediate;   Good Recent;   Good Remote;   Good  Judgement:  Good  Insight:  Good  Psychomotor Activity:  Normal  Concentration:  Concentration: Good and Attention Span: Good  Recall:  Good  Fund of Knowledge:Good  Language: Good  Akathisia:  No  Handed:  Right  AIMS (if indicated):  not done  Assets:  Communication Skills Desire for Improvement Housing Intimacy Leisure Time Physical Health Social Support  ADL's:  Intact  Cognition: WNL  Sleep:  Poor   Screenings: AUDIT    Flowsheet Row [facility]  Alcohol Use Disorder Identification Test Final Score (AUDIT) 1      GAD-7    Flowsheet Row Office Visit from 10/06/2023 in [facility]  Total GAD-7 Score 18      PHQ2-9    Flowsheet Row Office Visit from 10/06/2023 in [facility]  PHQ-2 Total Score 3  PHQ-9  Total Score 20      Flowsheet Row [facility] Most recent reading at 09/06/2023  4:40 PM ED from 09/06/2023 in [facility] Most recent reading at 09/06/2023  1:00 PM ED from 08/18/2023 in [facility] Emergency Department at [facility] Most recent reading at 08/18/2023  6:06 PM  C-SSRS RISK CATEGORY High Risk Error: Question 6 not populated No Risk       Assessment and  Plan: Patient reports that he continues to have increased anxiety, depression, and poor sleep.  He notes that trazodone  caused vivid dreams and notes that he would like to discontinue it.  Today he is agreeable to starting mirtazapine  15 mg to help manage sleep, anxiety, and depression.  Patient also notes that the Nicorette  gum caused him to have a sore throat and at this time he would like to discontinue it.  He will start Nicorette  CQ 21 mg patches to help manage tobacco dependence.  1. Tobacco use disorder (Primary)  Start- nicotine  (NICODERM CQ ) 21 mg/24hr patch; Place 1 patch (21 mg total) onto the skin daily.  Dispense: 28 patch; Refill: 3  2. Generalized anxiety disorder  Continue- hydrOXYzine  (ATARAX ) 25 MG tablet; Take 1 tablet (25 mg total) by mouth 3 (three) times daily.  Dispense: 90 tablet; Refill: 3 Continue- sertraline  (ZOLOFT ) 100 MG tablet; Take 1 tablet (100 mg total) by mouth daily.  Dispense: 30 tablet; Refill: 3 Continue- melatonin 5 MG TABS; Take 1 tablet (5 mg total) by mouth at bedtime.  Dispense: 30 tablet; Refill: 3 Start- mirtazapine  (REMERON ) 15 MG tablet; Take 1 tablet (15 mg total) by mouth at bedtime.  Dispense: 30 tablet; Refill: 3  3. MDD (major depressive disorder), recurrent severe, without psychosis (HCC)  Continue- sertraline  (ZOLOFT ) 100 MG tablet; Take 1 tablet (100 mg total) by mouth daily.  Dispense: 30 tablet; Refill: 3 Continue- melatonin 5 MG TABS; Take 1 tablet (5  mg total) by mouth at bedtime.  Dispense: 30 tablet; Refill: 3 Start- mirtazapine  (REMERON ) 15 MG tablet; Take 1 tablet (15 mg total) by mouth at bedtime.  Dispense: 30 tablet; Refill: 3   Collaboration of Care: Other provider involved in patient's care AEB PCP  Patient/Guardian was advised Release of Information must be obtained prior to any record release in order to collaborate their care with an outside provider. Patient/Guardian was advised if they have not already done so to contact the registration department to sign all necessary forms in order for us  to release information regarding their care.   Consent: Patient/Guardian gives verbal consent for treatment and assignment of benefits for services provided during this visit. Patient/Guardian expressed understanding and agreed to proceed.   Follow-up in 2 months Zane FORBES Bach, NP 1/13/20251:14 PM

## 2023-11-14 ENCOUNTER — Emergency Department (HOSPITAL_BASED_OUTPATIENT_CLINIC_OR_DEPARTMENT_OTHER)
Admission: EM | Admit: 2023-11-14 | Discharge: 2023-11-14 | Disposition: A | Discharge status: Home / Self Care | Payer: 59 | Attending: Emergency Medicine | Admitting: Emergency Medicine

## 2023-11-14 ENCOUNTER — Other Ambulatory Visit (HOSPITAL_BASED_OUTPATIENT_CLINIC_OR_DEPARTMENT_OTHER): Payer: Self-pay

## 2023-11-14 ENCOUNTER — Other Ambulatory Visit: Payer: Self-pay

## 2023-11-14 DIAGNOSIS — M79662 Pain in left lower leg: Secondary | ICD-10-CM | POA: Diagnosis not present

## 2023-11-14 DIAGNOSIS — R202 Paresthesia of skin: Secondary | ICD-10-CM | POA: Insufficient documentation

## 2023-11-14 DIAGNOSIS — M79604 Pain in right leg: Secondary | ICD-10-CM

## 2023-11-14 DIAGNOSIS — M79661 Pain in right lower leg: Secondary | ICD-10-CM | POA: Insufficient documentation

## 2023-11-14 LAB — BASIC METABOLIC PANEL
Anion gap: 8 (ref 5–15)
BUN: 18 mg/dL (ref 6–20)
CO2: 25 mmol/L (ref 22–32)
Calcium: 9.6 mg/dL (ref 8.9–10.3)
Chloride: 106 mmol/L (ref 98–111)
Creatinine, Ser: 0.88 mg/dL (ref 0.61–1.24)
GFR, Estimated: >60 mL/min (ref >60)
Glucose, Bld: 114 mg/dL — ABNORMAL HIGH (ref 70–99)
Potassium: 3.8 mmol/L (ref 3.5–5.1)
Sodium: 139 mmol/L (ref 135–145)

## 2023-11-14 LAB — CBC
HCT: 42.8 % (ref 39.0–52.0)
Hemoglobin: 14.8 g/dL (ref 13.0–17.0)
MCH: 31.4 pg (ref 26.0–34.0)
MCHC: 34.6 g/dL (ref 30.0–36.0)
MCV: 90.7 fL (ref 80.0–100.0)
Platelets: 239 10*3/uL (ref 150–400)
RBC: 4.72 MIL/uL (ref 4.22–5.81)
RDW: 12.1 % (ref 11.5–15.5)
WBC: 5.6 10*3/uL (ref 4.0–10.5)
nRBC: 0 % (ref 0.0–0.2)

## 2023-11-14 LAB — CK: Total CK: 156 U/L (ref 49–397)

## 2023-11-14 MED ORDER — KETOROLAC TROMETHAMINE 15 MG/ML IJ SOLN
15.0000 mg | Freq: Once | INTRAMUSCULAR | Status: AC
  Administered 2023-11-14: 15 mg via INTRAVENOUS
  Filled 2023-11-14: qty 1

## 2023-11-14 MED ORDER — IBUPROFEN 600 MG PO TABS
600.0000 mg | ORAL_TABLET | Freq: Four times a day (QID) | ORAL | 0 refills | Status: AC | PRN
  Filled 2023-11-14: qty 30, 8d supply, fill #0

## 2023-11-14 NOTE — ED Provider Notes (Signed)
 [place] EMERGENCY DEPARTMENT AT [facility] Provider Note   CSN: [id number] Arrival date & time: 11/14/23  9147     History  Chief Complaint  Patient presents with   Leg Pain    Brian Frank is a 28 y.o. male.  HPI   28 year old male presents emergency department with complaints of leg pain.  Patient states that he slept in his car last night with his significant other in the backs of his [employer] soul.  Says that they only had a few blankets and he got really cold last night.  States that he woke up this morning and had tingling sensation as well as pain from his calfs downward on both of his legs.  States that he felt his legs and they were cold.  Was at work and had pain with walking as well as still tingling sensation and was told to come to the emergency department for concerns for hypothermia.  Patient denies any trauma to legs, history of DVT, known coagulopathy, no malignancy, history of PE, hormonal therapy.  States that his sensation has been improving ever since his legs have been warmed but was sent here by his work.  Past medical history significant for polysubstance use, MDD, GAD  Home Medications Prior to Admission medications   Medication Sig Start Date End Date Taking? Authorizing Provider  ibuprofen (ADVIL) 600 MG tablet Take 1 tablet (600 mg total) by mouth every 6 (six) hours as needed. 11/14/23  Yes Sherian Maroon A, PA  ascorbic acid (VITAMIN C) 500 MG tablet Take 500 mg by mouth daily as needed (immune system booster).    [provider]  hydrOXYzine (ATARAX) 25 MG tablet Take 1 tablet (25 mg total) by mouth 3 (three) times daily. 10/06/23   Toy Cookey E, NP  melatonin 5 MG TABS Take 1 tablet (5 mg total) by mouth at bedtime. 10/06/23   Shanna Cisco, NP  mirtazapine (REMERON) 15 MG tablet Take 1 tablet (15 mg total) by mouth at bedtime. 10/06/23   Shanna Cisco, NP  nicotine (NICODERM CQ) 21 mg/24hr patch Place 1 patch (21 mg  total) onto the skin daily. 10/06/23   Shanna Cisco, NP  sertraline (ZOLOFT) 100 MG tablet Take 1 tablet (100 mg total) by mouth daily. 10/06/23   Shanna Cisco, NP      Allergies    Strawberry extract    Review of Systems   Review of Systems  All other systems reviewed and are negative.   Physical Exam Updated Vital Signs BP 110/79 (BP Location: Right Arm)   Pulse 66   Temp 97.7 F (36.5 C) (Oral)   Resp 17   Ht 6\' 3"  (1.905 m)   Wt 74.8 kg   SpO2 99%   BMI 20.62 kg/m  Physical Exam Vitals and nursing note reviewed.  Constitutional:      General: He is not in acute distress.    Appearance: He is well-developed.  HENT:     Head: Normocephalic and atraumatic.  Eyes:     Conjunctiva/sclera: Conjunctivae normal.  Cardiovascular:     Rate and Rhythm: Normal rate and regular rhythm.     Heart sounds: No murmur heard. Pulmonary:     Effort: Pulmonary effort is normal. No respiratory distress.     Breath sounds: Normal breath sounds.  Abdominal:     Palpations: Abdomen is soft.     Tenderness: There is no abdominal tenderness.  Musculoskeletal:  General: No swelling.     Cervical back: Neck supple.     Comments: Pedal and posterior tibial pulses 2+ bilaterally.  Capillary refill less than 2 seconds.  Digits on toes bilaterally cool to the touch but without pallor, evidence of necrosis.  Patient seen walking into the emergency department but will not lift his leg off of the bed without using his hands on exam.  Will dorsi/plantarflex his ankle as well as move his digits on bilateral feet.  No overlying erythema, palpable fluctuance/induration.  Skin:    General: Skin is warm and dry.     Capillary Refill: Capillary refill takes less than 2 seconds.  Neurological:     Mental Status: He is alert.  Psychiatric:        Mood and Affect: Mood normal.     ED Results / Procedures / Treatments   Labs (all labs ordered are listed, but only abnormal results are  displayed) Labs Reviewed  BASIC METABOLIC PANEL - Abnormal; Notable for the following components:      Result Value   Glucose, Bld 114 (*)    All other components within normal limits  CBC  CK    EKG None  Radiology No results found.  Procedures Procedures    Medications Ordered in ED Medications  ketorolac (TORADOL) 15 MG/ML injection 15 mg (15 mg Intravenous Given 11/14/23 0957)    ED Course/ Medical Decision Making/ A&P                                 Medical Decision Making  This patient presents to the ED for concern of leg pain, this involves an extensive number of treatment options, and is a complaint that carries with it a high risk of complications and morbidity.  The differential diagnosis includes fracture, dislocation, strain/sprain, ligamentous/tendinous injury, DVT, PAD, ischemic limb, cellulitis, negative for infection, other   Co morbidities that complicate the patient evaluation  See HPI   Additional history obtained:  Additional history obtained from EMR External records from outside source obtained and reviewed including hospital records   Lab Tests:  I Ordered, and personally interpreted labs.  The pertinent results include: No leukocytosis.  No evidence of anemia.  Platelets within range. ***    Imaging Studies ordered:  N/a   Cardiac Monitoring: / EKG:  The patient was maintained on a cardiac monitor.  I personally viewed and interpreted the cardiac monitored which showed an underlying rhythm of: Sinus rhythm   Consultations Obtained:  N/a   Problem List / ED Course / Critical interventions / Medication management  Leg pain I ordered medication including Toradol  Reevaluation of the patient after these medicines showed that the patient improved I have reviewed the patients home medicines and have made adjustments as needed   Social Determinants of Health:  Polysubstance use   Test / Admission - Considered:  Leg  pain Vitals signs  within normal range and stable throughout visit. Laboratory studies significant for: See above 28 year old male presents emergency department complaints of bilateral leg pain after sleeping in his car last night.  Reports a person at his work states that his feet were cold and was concerned about hypothermia.  On exam, symmetric pulses bilateral extremities with less than 2 seconds capillary refill.  No evidence of limb/digit ischemia on exam.  No overlying skin changes concerning for secondary infectious process.  No lower extremity edema concerning for  DVT.  Given patient's bilateral calf pain to feet, laboratory studies obtained including CK of which were normal.  Suspect that patient's symptoms were likely secondary to sleeping on the cold given that they have noted significant improvement since being in side.  Recommend continued warming and taking Tylenol/Motrin for pain.  Treatment plan discussed at length with patient and he acknowledged understanding was agreeable to said plan.  Patient overall well-appearing, afebrile in no acute distress. Worrisome signs and symptoms were discussed with the patient, and the patient acknowledged understanding to return to the ED if noticed. Patient was stable upon discharge.          Final Clinical Impression(s) / ED Diagnoses Final diagnoses:  Pain in both lower extremities    Rx / DC Orders ED Discharge Orders          Ordered    ibuprofen (ADVIL) 600 MG tablet  Every 6 hours PRN        11/14/23 1053              Peter Garter, [place] 11/14/23 1115    Anders Simmonds T, DO 11/16/23 [phone]

## 2023-11-14 NOTE — Discharge Instructions (Addendum)
 As discussed, your labs were normal.  Continue to take medication for pain/inflammation.  Attached is number to call to schedule follow-up appointment regarding your ER visit today.  Please do not hesitate to return if the worrisome signs and symptoms we discussed become apparent.

## 2023-11-14 NOTE — ED Triage Notes (Signed)
 Pt via pov from home with bilateral leg pain today. Pt reports that he slept in his car last night and awakened with leg pain and difficulty standing. He states the nurse at work told him he may have hypothermia. Pt alert & oriented, nad noted.

## 2023-11-24 ENCOUNTER — Other Ambulatory Visit (HOSPITAL_BASED_OUTPATIENT_CLINIC_OR_DEPARTMENT_OTHER): Payer: Self-pay

## 2023-11-28 ENCOUNTER — Ambulatory Visit
Admission: EM | Admit: 2023-11-28 | Discharge: 2023-11-28 | Disposition: A | Discharge status: Home / Self Care | Attending: Family Medicine | Admitting: Family Medicine

## 2023-11-28 DIAGNOSIS — J029 Acute pharyngitis, unspecified: Secondary | ICD-10-CM

## 2023-11-28 DIAGNOSIS — B349 Viral infection, unspecified: Secondary | ICD-10-CM

## 2023-11-28 DIAGNOSIS — R051 Acute cough: Secondary | ICD-10-CM | POA: Diagnosis present

## 2023-11-28 LAB — POCT INFLUENZA A/B
Influenza A, POC: NEGATIVE
Influenza B, POC: NEGATIVE

## 2023-11-28 LAB — POCT RAPID STREP A (OFFICE): Rapid Strep A Screen: NEGATIVE

## 2023-11-28 MED ORDER — LIDOCAINE VISCOUS HCL 2 % MT SOLN: 15.0000 mL | Freq: Four times a day (QID) | OROMUCOSAL | 0 refills | Status: AC | PRN

## 2023-11-28 NOTE — Discharge Instructions (Addendum)
 Please treat your symptoms with over the counter cough medication, tylenol or ibuprofen, humidifier, and rest.  You can do the lidocaine gargle every 6 hours as needed for your sore throat.  Please gargle and spit do not swallow this.  Viral illnesses can last 7-14 days. Please follow up with your PCP if your symptoms are not improving. Please go to the ER for any worsening symptoms. This includes but is not limited to fever you can not control with tylenol or ibuprofen, you are not able to stay hydrated, you have shortness of breath or chest pain.  Thank you for choosing [facility] for your healthcare needs. I hope you feel better soon!

## 2023-11-28 NOTE — ED Triage Notes (Addendum)
 Pt states he woke up this morning with a fever,body ache and a headache.  States he took dayquil and BC powder. States the nurse at his job sent him in for a PCR covid test.

## 2023-11-28 NOTE — ED Provider Notes (Signed)
 [facility]    CSN: [id number] Arrival date & time: 11/28/23  4098      History   Chief Complaint Chief Complaint  Patient presents with   Fever    HPI Brian Frank is a 28 y.o. male  presents for evaluation of URI symptoms for 1 days. Patient reports associated symptoms of chills, body aches, cough, congestion, sore throat, headache, fatigue. Denies N/V/D, ear pain shortness of breath. Patient does not have a hx of asthma. Patient is an active smoker.   Reports sick contacts via work [employer].  Pt has taken BC powder and DayQuil OTC for symptoms.  Reports his employer is requiring him to get a COVID PCR test.  Pt has no other concerns at this time.    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 No pertinent surgical history.     Home Medications    Prior to Admission medications   Medication Sig Start Date End Date Taking? Authorizing Provider  lidocaine (XYLOCAINE) 2 % solution Use as directed 15 mLs in the mouth or throat every 6 (six) hours as needed (sore throat). Gargle and spit do not swallow 11/28/23  Yes Radford Pax, NP  ascorbic acid (VITAMIN C) 500 MG tablet Take 500 mg by mouth daily as needed (immune system booster).    [provider]  hydrOXYzine (ATARAX) 25 MG tablet Take 1 tablet (25 mg total) by mouth 3 (three) times daily. 10/06/23   Shanna Cisco, NP  ibuprofen (ADVIL) 600 MG tablet Take 1 tablet (600 mg total) by mouth every 6 (six) hours as needed. 11/14/23   Sherian Maroon A, PA  melatonin 5 MG TABS Take 1 tablet (5 mg total) by mouth at bedtime. 10/06/23   Shanna Cisco, NP  mirtazapine (REMERON) 15 MG tablet Take 1 tablet (15 mg total) by mouth at bedtime. 10/06/23   Shanna Cisco, NP  nicotine (NICODERM CQ) 21 mg/24hr patch Place 1 patch (21 mg total) onto the skin daily. 10/06/23   Shanna Cisco, NP  sertraline (ZOLOFT) 100 MG tablet Take 1 tablet (100 mg total) by mouth daily. 10/06/23   Shanna Cisco, NP    Family History History reviewed. No pertinent family history.  Social History Social History   Tobacco Use   Smoking status: Former    Types: Cigarettes   Smokeless tobacco: Never  Vaping Use   Vaping status: Every Day   Substances: Nicotine  Substance Use Topics   Alcohol use: Yes   Drug use: Yes    Types: Marijuana     Allergies   Strawberry extract   Review of Systems Review of Systems  Constitutional:  Positive for chills, fatigue and fever.  HENT:  Positive for congestion and sore throat.   Respiratory:  Positive for cough.   Musculoskeletal:  Positive for myalgias.     Physical Exam Triage Vital Signs ED Triage Vitals  Encounter Vitals Group     BP 11/28/23 0857 111/74     Systolic BP Percentile --      Diastolic BP Percentile --  Pulse Rate 11/28/23 0857 84     Resp 11/28/23 0857 16     Temp 11/28/23 0857 98.9 F (37.2 C)     Temp Source 11/28/23 0857 Oral     SpO2 11/28/23 0857 98 %     Weight --      Height --      Head Circumference --      Peak Flow --      Pain Score 11/28/23 0858 0     Pain Loc --      Pain Education --      Exclude from Growth Chart --    No data found.  Updated Vital Signs BP 111/74 (BP Location: Left Arm)   Pulse 84   Temp 98.9 F (37.2 C) (Oral)   Resp 16   SpO2 98%   Visual Acuity Right Eye Distance:   Left Eye Distance:   Bilateral Distance:    Right Eye Near:   Left Eye Near:    Bilateral Near:     Physical Exam Vitals and nursing note reviewed.  Constitutional:      General: He is not in acute distress.    Appearance: Normal  appearance. He is not ill-appearing or toxic-appearing.  HENT:     Head: Normocephalic and atraumatic.     Right Ear: Tympanic membrane and ear canal normal.     Left Ear: Tympanic membrane and ear canal normal.     Nose: Congestion present.     Mouth/Throat:     Mouth: Mucous membranes are moist.     Pharynx: Posterior oropharyngeal erythema present.  Eyes:     Pupils: Pupils are equal, round, and reactive to light.  Cardiovascular:     Rate and Rhythm: Normal rate and regular rhythm.     Heart sounds: Normal heart sounds.  Pulmonary:     Effort: Pulmonary effort is normal.     Breath sounds: Normal breath sounds.  Musculoskeletal:     Cervical back: Normal range of motion and neck supple.  Lymphadenopathy:     Cervical: No cervical adenopathy.  Skin:    General: Skin is warm and dry.  Neurological:     General: No focal deficit present.     Mental Status: He is alert and oriented to person, place, and time.  Psychiatric:        Mood and Affect: Mood normal.        Behavior: Behavior normal.      UC Treatments / Results  Labs (all labs ordered are listed, but only abnormal results are displayed) Labs Reviewed  SARS CORONAVIRUS 2 (TAT 6-24 HRS)  POCT INFLUENZA A/B  POCT RAPID STREP A (OFFICE)    EKG   Radiology No results found.  Procedures Procedures (including critical care time)  Medications Ordered in UC Medications - No data to display  Initial Impression / Assessment and Plan / UC Course  I have reviewed the triage vital signs and the nursing notes.  Pertinent labs & imaging results that were available during my care of the patient were reviewed by me and considered in my medical decision making (see chart for details).     Reviewed exam and symptoms with patient.  No red flags.  Negative rapid strep and flu testing.  COVID PCR as requested by patient will contact if positive.  Discussed viral illness and symptomatic treatment.  Lidocaine gargle as  needed sore throat.  Patient instructed to gargle and spit not to swallow.  PCP follow-up if symptoms do not improve.  ER precautions reviewed and patient verbalized understanding. Final Clinical Impressions(s) / UC Diagnoses   Final diagnoses:  Acute cough  Sore throat  Viral illness     Discharge Instructions       Please treat your symptoms with over the counter cough medication, tylenol or ibuprofen, humidifier, and rest.  You can do the lidocaine gargle every 6 hours as needed for your sore throat.  Please gargle and spit do not swallow this.  Viral illnesses can last 7-14 days. Please follow up with your PCP if your symptoms are not improving. Please go to the ER for any worsening symptoms. This includes but is not limited to fever you can not control with tylenol or ibuprofen, you are not able to stay hydrated, you have shortness of breath or chest pain.  Thank you for choosing [facility] for your healthcare needs. I hope you feel better soon!      ED Prescriptions     Medication Sig Dispense Auth. Provider   lidocaine (XYLOCAINE) 2 % solution Use as directed 15 mLs in the mouth or throat every 6 (six) hours as needed (sore throat). Gargle and spit do not swallow 100 mL Radford Pax, NP      PDMP not reviewed this encounter.   Radford Pax, NP 11/28/23 1005

## 2023-11-29 ENCOUNTER — Encounter (INDEPENDENT_AMBULATORY_CARE_PROVIDER_SITE_OTHER): Payer: Self-pay

## 2023-11-30 LAB — SARS CORONAVIRUS 2 (TAT 6-24 HRS): SARS Coronavirus 2: POSITIVE — AB

## 2023-12-04 ENCOUNTER — Telehealth (HOSPITAL_COMMUNITY): Payer: PRIVATE HEALTH INSURANCE | Admitting: Psychiatry

## 2023-12-04 ENCOUNTER — Encounter (HOSPITAL_COMMUNITY): Payer: Self-pay | Admitting: Psychiatry

## 2023-12-04 ENCOUNTER — Other Ambulatory Visit: Payer: Self-pay

## 2023-12-04 DIAGNOSIS — F411 Generalized anxiety disorder: Secondary | ICD-10-CM | POA: Diagnosis not present

## 2023-12-04 DIAGNOSIS — F332 Major depressive disorder, recurrent severe without psychotic features: Secondary | ICD-10-CM | POA: Diagnosis not present

## 2023-12-04 DIAGNOSIS — Z720 Tobacco use: Secondary | ICD-10-CM | POA: Diagnosis not present

## 2023-12-04 DIAGNOSIS — F17200 Nicotine dependence, unspecified, uncomplicated: Secondary | ICD-10-CM

## 2023-12-04 MED ORDER — SERTRALINE HCL 100 MG PO TABS
100.0000 mg | ORAL_TABLET | Freq: Every day | ORAL | 3 refills | Status: DC
  Filled 2023-12-04: qty 30, 30d supply, fill #0

## 2023-12-04 MED ORDER — HYDROXYZINE HCL 10 MG PO TABS
10.0000 mg | ORAL_TABLET | Freq: Three times a day (TID) | ORAL | 3 refills | Status: DC
  Filled 2023-12-04: qty 90, 30d supply, fill #0

## 2023-12-04 MED ORDER — MELATONIN 5 MG PO TABS
5.0000 mg | ORAL_TABLET | Freq: Every day | ORAL | 3 refills | Status: DC
  Filled 2023-12-04: qty 30, 30d supply, fill #0

## 2023-12-04 MED ORDER — MIRTAZAPINE 15 MG PO TABS
15.0000 mg | ORAL_TABLET | Freq: Every day | ORAL | 3 refills | Status: DC
  Filled 2023-12-04: qty 30, 30d supply, fill #0

## 2023-12-04 MED ORDER — NICOTINE 21 MG/24HR TD PT24
21.0000 mg | MEDICATED_PATCH | Freq: Every day | TRANSDERMAL | 3 refills | Status: DC
Start: 2023-12-04 — End: 2024-02-18
  Filled 2023-12-04: qty 28, 28d supply, fill #0
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
## 2023-12-16 ENCOUNTER — Other Ambulatory Visit: Payer: Self-pay

## 2024-02-18 ENCOUNTER — Encounter (HOSPITAL_COMMUNITY): Payer: Self-pay | Admitting: Psychiatry

## 2024-02-18 ENCOUNTER — Telehealth (HOSPITAL_COMMUNITY): Payer: PRIVATE HEALTH INSURANCE | Admitting: Psychiatry

## 2024-02-18 ENCOUNTER — Other Ambulatory Visit: Payer: Self-pay

## 2024-02-18 DIAGNOSIS — F332 Major depressive disorder, recurrent severe without psychotic features: Secondary | ICD-10-CM | POA: Diagnosis not present

## 2024-02-18 DIAGNOSIS — F17200 Nicotine dependence, unspecified, uncomplicated: Secondary | ICD-10-CM | POA: Diagnosis not present

## 2024-02-18 DIAGNOSIS — F411 Generalized anxiety disorder: Secondary | ICD-10-CM

## 2024-02-18 MED ORDER — SERTRALINE HCL 100 MG PO TABS
100.0000 mg | ORAL_TABLET | Freq: Every day | ORAL | 3 refills | Status: AC
  Filled 2024-02-18: qty 30, 30d supply, fill #0

## 2024-02-18 MED ORDER — MELATONIN 5 MG PO TABS
5.0000 mg | ORAL_TABLET | Freq: Every day | ORAL | 3 refills | Status: AC
Start: 2024-02-18 — End: ?
  Filled 2024-02-18: qty 30, 30d supply, fill #0

## 2024-02-18 MED ORDER — MIRTAZAPINE 15 MG PO TABS
15.0000 mg | ORAL_TABLET | Freq: Every day | ORAL | 3 refills | Status: AC
Start: 2024-02-18 — End: ?
  Filled 2024-02-18: qty 30, 30d supply, fill #0

## 2024-02-18 MED ORDER — NICOTINE 21 MG/24HR TD PT24
21.0000 mg | MEDICATED_PATCH | Freq: Every day | TRANSDERMAL | 3 refills | Status: AC
  Filled 2024-02-18: qty 28, 28d supply, fill #0

## 2024-02-18 MED ORDER — HYDROXYZINE HCL 10 MG PO TABS
10.0000 mg | ORAL_TABLET | Freq: Three times a day (TID) | ORAL | 3 refills | Status: AC
Start: 2024-02-18 — End: ?
  Filled 2024-02-18: qty 90, 30d supply, fill #0

## 2024-02-18 NOTE — Progress Notes (Signed)
 [facility] MD/PA/NP OP Progress Note Virtual Visit via Video Note  I connected with Brian Frank on 02/18/24 at  2:00 PM EDT by a video enabled telemedicine application and verified that I am speaking with the correct person using two identifiers.  Location: Patient: Home Provider: Clinic   I discussed the limitations of evaluation and management by telemedicine and the availability of in person appointments. The patient expressed understanding and agreed to proceed.  I provided 15 minutes of non-face-to-face time during this encounter.   02/18/2024 12:10 PM Brian Frank  MRN:  [id number]  Chief Complaint: "I have anxiety and depression"  HPI: 28 year old male seen today for initial psychiatric evaluation.  He has a psychiatric history of depression, anxiety, tobacco use, marijuana use, and SI self-reported ADHD.  He is currently prescribed Mirtazapine  15 mg nightly, Zoloft  100 mg daily, hydroxyzine  25 mg 3 times a day as needed, melatonin 5 mg nightly, and Nicorette  CQ 21 mg patches.  Patient reports that his medications are effective in managing his mental health.  Today he is well-groomed, pleasant, cooperative, and engaged in conversation.  He informed Clinical research associate that he continues to have some anxiety and depression but notes things has improved since his last visit.  He denies having current suicidal ideation.  Patient notes that he is unable to do a GAD-7 or PHQ-9 today as he is at work.  He does endorse adequate sleep and appetite.  Today he denies SI/HI/AVH, mania, paranoia.    Overall patient notes that he is doing well and request that medications not be adjusted.  No medication changes made today.  Patient agreeable to continue medication as scribed. Visit Diagnosis:    ICD-10-CM   1. Generalized anxiety disorder  F41.1 hydrOXYzine  (ATARAX ) 10 MG tablet    sertraline  (ZOLOFT ) 100 MG tablet    mirtazapine  (REMERON ) 15 MG tablet    melatonin 5 MG TABS    2. MDD (major depressive disorder),  recurrent severe, without psychosis (HCC)  F33.2 sertraline  (ZOLOFT ) 100 MG tablet    mirtazapine  (REMERON ) 15 MG tablet    melatonin 5 MG TABS    3. Tobacco use disorder  F17.200 nicotine  (NICODERM CQ ) 21 mg/24hr patch       Past Psychiatric History:  Depression, Anxiety, Insomnia, Tobacco use,  self-reported ADHD, and Marijuana use   Past Medical History:  Past Medical History:  Diagnosis Date   Renal disorder    No past surgical history on file.  Family Psychiatric History:  Mother DID, Sister PTSD, and father undiagnosed mental health issues   Family History: No family history on file.  Social History:  Social History   Socioeconomic History   Marital status: Single    Spouse name: Not on file   Number of children: Not on file   Years of education: Not on file   Highest education level: Not on file  Occupational History   Not on file  Tobacco Use   Smoking status: Former    Types: Cigarettes   Smokeless tobacco: Never  Vaping Use   Vaping status: Every Day   Substances: Nicotine   Substance and Sexual Activity   Alcohol use: Yes   Drug use: Yes    Types: Marijuana   Sexual activity: Not on file  Other Topics Concern   Not on file  Social History Narrative   Not on file   Social Drivers of Health   Financial Resource Strain: Not on file  Food Insecurity: Food Insecurity Present (09/06/2023)  Hunger Vital Sign    Worried About Running Out of Food in the Last Year: Often true    Ran Out of Food in the Last Year: Sometimes true  Transportation Needs: No Transportation Needs (09/06/2023)   PRAPARE - Administrator, Civil Service (Medical): No    Lack of Transportation (Non-Medical): No  Physical Activity: Not on file  Stress: Not on file  Social Connections: Not on file    Allergies:  Allergies  Allergen Reactions   Strawberry Extract Anaphylaxis    Strawberry seeds is the main problem    Metabolic Disorder Labs: Lab Results   Component Value Date   HGBA1C 5.1 09/06/2023   MPG 99.67 09/06/2023   No results found for: "PROLACTIN" Lab Results  Component Value Date   CHOL 166 09/06/2023   TRIG 50 09/06/2023   HDL 49 09/06/2023   CHOLHDL 3.4 09/06/2023   VLDL 10 09/06/2023   LDLCALC 107 (H) 09/06/2023   Lab Results  Component Value Date   TSH 0.743 09/06/2023    Therapeutic Level Labs: No results found for: "LITHIUM" No results found for: "VALPROATE" No results found for: "CBMZ"  Current Medications: Current Outpatient Medications  Medication Sig Dispense Refill   ascorbic acid (VITAMIN C) 500 MG tablet Take 500 mg by mouth daily as needed (immune system booster).     hydrOXYzine  (ATARAX ) 10 MG tablet Take 1 tablet (10 mg total) by mouth 3 (three) times daily. 90 tablet 3   ibuprofen  (ADVIL ) 600 MG tablet Take 1 tablet (600 mg total) by mouth every 6 (six) hours as needed. 30 tablet 0   lidocaine  (XYLOCAINE ) 2 % solution Use as directed 15 mLs in the mouth or throat every 6 (six) hours as needed (sore throat). Gargle and spit do not swallow 100 mL 0   melatonin 5 MG TABS Take 1 tablet (5 mg total) by mouth at bedtime. 30 tablet 3   mirtazapine  (REMERON ) 15 MG tablet Take 1 tablet (15 mg total) by mouth at bedtime. 30 tablet 3   nicotine  (NICODERM CQ ) 21 mg/24hr patch Place 1 patch (21 mg total) onto the skin daily. 28 patch 3   sertraline  (ZOLOFT ) 100 MG tablet Take 1 tablet (100 mg total) by mouth daily. 30 tablet 3   No current facility-administered medications for this visit.     Musculoskeletal: Strength & Muscle Tone: within normal limits and Telehealth visit Gait & Station: normal, Telehealth visit Patient leans: N/A  Psychiatric Specialty Exam: Review of Systems  There were no vitals taken for this visit.There is no height or weight on file to calculate BMI.  General Appearance: Well Groomed  Eye Contact:  Good  Speech:  Clear and Coherent and Normal Rate  Volume:  Normal  Mood:   Anxious and Depressed  Affect:  Appropriate and Congruent  Thought Process:  Coherent, Goal Directed, and Linear  Orientation:  Full (Time, Place, and Person)  Thought Content: WDL and Logical   Suicidal Thoughts:  No  Homicidal Thoughts:  No  Memory:  Immediate;   Good Recent;   Good Remote;   Good  Judgement:  Good  Insight:  Good  Psychomotor Activity:  Normal  Concentration:  Concentration: Good and Attention Span: Good  Recall:  Good  Fund of Knowledge: Good  Language: Good  Akathisia:  No  Handed:  Right  AIMS (if indicated): not done  Assets:  Communication Skills Desire for Improvement Financial Resources/Insurance Housing Intimacy Leisure Time Physical Health  Social Support Talents/Skills Transportation  ADL's:  Intact  Cognition: WNL  Sleep:  Good   Screenings: AUDIT    Flowsheet Row [facility]  Alcohol Use Disorder Identification Test Final Score (AUDIT) 1      GAD-7    Flowsheet Row Video Visit from 12/04/2023 in [facility] Office Visit from 10/06/2023 in [facility]  Total GAD-7 Score 20 18      PHQ2-9    Flowsheet Row Video Visit from 12/04/2023 in [facility] Office Visit from 10/06/2023 in [facility]  PHQ-2 Total Score 6 3  PHQ-9 Total Score 24 20      Flowsheet Row Video Visit from 12/04/2023 in [facility] from 11/28/2023 in [facility] ED from 11/14/2023 in [facility] Emergency Department at [facility]  C-SSRS RISK CATEGORY Error: Q7 should not be populated when Q6 is No No Risk No Risk        Assessment and Plan: Patient endorses increased anxiety and depression byt notes that it has improved. No medication changes made today. Patient agreeable to continue  medications as prescribed.  1. Generalized anxiety disorder  Continue- hydrOXYzine  (ATARAX ) 10 MG tablet; Take 1 tablet (10 mg total) by mouth 3 (three) times daily.  Dispense: 90 tablet; Refill: 3 Continue- melatonin 5 MG TABS; Take 1 tablet (5 mg total) by mouth at bedtime.  Dispense: 30 tablet; Refill: 3 Continue- mirtazapine  (REMERON ) 15 MG tablet; Take 1 tablet (15 mg total) by mouth at bedtime.  Dispense: 30 tablet; Refill: 3 Continue- sertraline  (ZOLOFT ) 100 MG tablet; Take 1 tablet (100 mg total) by mouth daily.  Dispense: 30 tablet; Refill: 3  2. MDD (major depressive disorder), recurrent severe, without psychosis (HCC)  Continue- melatonin 5 MG TABS; Take 1 tablet (5 mg total) by mouth at bedtime.  Dispense: 30 tablet; Refill: 3 Continue- mirtazapine  (REMERON ) 15 MG tablet; Take 1 tablet (15 mg total) by mouth at bedtime.  Dispense: 30 tablet; Refill: 3 Continue- sertraline  (ZOLOFT ) 100 MG tablet; Take 1 tablet (100 mg total) by mouth daily.  Dispense: 30 tablet; Refill: 3  3. Tobacco use disorder Continue- nicotine  (NICODERM CQ ) 21 mg/24hr patch; Place 1 patch (21 mg total) onto the skin daily.  Dispense: 28 patch; Refill: 3   Collaboration of Care: Collaboration of Care: Other provider involved in patient's care AEB PCP  Patient/Guardian was advised Release of Information must be obtained prior to any record release in order to collaborate their care with an outside provider. Patient/Guardian was advised if they have not already done so to contact the registration department to sign all necessary forms in order for us  to release information regarding their care.   Consent: Patient/Guardian gives verbal consent for treatment and assignment of benefits for services provided during this visit. Patient/Guardian expressed understanding and agreed to proceed.   Follow up in 2.5 months   Arlyne Bering, NP 02/18/2024, 12:10 PM

## 2024-03-01 ENCOUNTER — Other Ambulatory Visit: Payer: Self-pay

## 2024-04-21 ENCOUNTER — Telehealth (HOSPITAL_COMMUNITY): Payer: PRIVATE HEALTH INSURANCE | Admitting: Psychiatry

## 2024-04-30 ENCOUNTER — Encounter (HOSPITAL_COMMUNITY): Payer: Self-pay

## 2024-04-30 ENCOUNTER — Telehealth (HOSPITAL_COMMUNITY): Payer: PRIVATE HEALTH INSURANCE | Admitting: Family
